# Patient Record
Sex: Male | Born: 1995 | Race: Black or African American | Hispanic: No | Marital: Single | Smoking: Never smoker
Health system: Southern US, Community
[De-identification: ages and names within clinical notes are randomized; demographics above are authoritative.]

## PROBLEM LIST (undated history)

## (undated) DIAGNOSIS — Z9109 Other allergy status, other than to drugs and biological substances: Secondary | ICD-10-CM

## (undated) DIAGNOSIS — J45909 Unspecified asthma, uncomplicated: Secondary | ICD-10-CM

## (undated) DIAGNOSIS — U071 COVID-19: Secondary | ICD-10-CM

## (undated) HISTORY — DX: Unspecified asthma, uncomplicated: J45.909

## (undated) HISTORY — DX: Other allergy status, other than to drugs and biological substances: Z91.09

---
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
## 2016-04-04 ENCOUNTER — Emergency Department (HOSPITAL_COMMUNITY)
Admission: EM | Admit: 2016-04-04 | Discharge: 2016-04-04 | Disposition: A | Discharge status: Home / Self Care | Payer: BLUE CROSS/BLUE SHIELD | Attending: Emergency Medicine | Admitting: Emergency Medicine

## 2016-04-04 ENCOUNTER — Encounter (HOSPITAL_COMMUNITY): Payer: Self-pay

## 2016-04-04 ENCOUNTER — Emergency Department (HOSPITAL_COMMUNITY): Payer: BLUE CROSS/BLUE SHIELD

## 2016-04-04 DIAGNOSIS — F901 Attention-deficit hyperactivity disorder, predominantly hyperactive type: Secondary | ICD-10-CM | POA: Insufficient documentation

## 2016-04-04 DIAGNOSIS — J45909 Unspecified asthma, uncomplicated: Secondary | ICD-10-CM | POA: Insufficient documentation

## 2016-04-04 DIAGNOSIS — Y929 Unspecified place or not applicable: Secondary | ICD-10-CM | POA: Diagnosis not present

## 2016-04-04 DIAGNOSIS — M25511 Pain in right shoulder: Secondary | ICD-10-CM | POA: Diagnosis present

## 2016-04-04 DIAGNOSIS — Y999 Unspecified external cause status: Secondary | ICD-10-CM | POA: Insufficient documentation

## 2016-04-04 DIAGNOSIS — Z79899 Other long term (current) drug therapy: Secondary | ICD-10-CM | POA: Diagnosis not present

## 2016-04-04 DIAGNOSIS — Y9367 Activity, basketball: Secondary | ICD-10-CM | POA: Insufficient documentation

## 2016-04-04 DIAGNOSIS — X58XXXA Exposure to other specified factors, initial encounter: Secondary | ICD-10-CM | POA: Diagnosis not present

## 2016-04-04 MED ORDER — HYDROCODONE-ACETAMINOPHEN 5-325 MG PO TABS: ORAL_TABLET | ORAL | 0 refills | Status: DC

## 2016-04-04 MED ORDER — IBUPROFEN 600 MG PO TABS: 600.0000 mg | ORAL_TABLET | Freq: Four times a day (QID) | ORAL | 0 refills | Status: DC | PRN

## 2016-04-04 NOTE — ED Triage Notes (Signed)
Pt reports was playing basketball and thinks dislocated his r shoulder.  Pt says thinks it went back in but still c/o pain.  Has history of same a few months ago.

## 2016-04-04 NOTE — Discharge Instructions (Signed)
Apply ice packs on/off to your shoulder.  Call your orthopedic doctor to arrange a follow-up appt.  °

## 2016-04-06 NOTE — ED Provider Notes (Signed)
[facility]-EM[facility] DEPT Provider Note   CSN: [id number] Arrival date & time: 04/04/16  1710     History   Chief Complaint Chief Complaint  Patient presents with  . Shoulder Pain    HPI Timothy Griffith is a 20 y.o. male.  HPI  Timothy Griffith is a 20 y.o. male who presents to the Emergency Department complaining of right shoulder pain after playing basketball.  Pain began after a reaching motion.  Describes a sudden, sharp pain at onset.  Pain improved with arm held near the body and worsens with raising the arm.  He believes that he may have dislocated his shoulder, but denies known previous injury.  He denies fall, swelling, neck pain, numbness or weakness of the extremity.   Past Medical History:  Diagnosis Date  . Asthma   . Environmental allergies     Patient Active Problem List   Diagnosis Date Noted  . Pain, joint, shoulder region, left 06/03/2012  . Exercise-induced asthma 06/03/2012  . Scoliosis 06/03/2012  . 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 ADD (attention deficit disorder with hyperactivity) 09/16/2011    History reviewed. No pertinent surgical history.     Home Medications    Prior to Admission medications   Medication Sig Start Date End Date Taking? Authorizing Provider  albuterol (PROVENTIL HFA;VENTOLIN HFA) 108 (90 BASE) MCG/ACT inhaler Inhale 2 puffs into the lungs every 6 (six) hours as needed for wheezing. 04/16/13   Elvina SidleKurt Lauenstein, MD  HYDROcodone-acetaminophen (NORCO/VICODIN) 5-325 MG tablet Take one-two tabs po q 4-6 hrs prn pain 04/04/16   Chavez Rosol, PA-C  ibuprofen (ADVIL,MOTRIN) 600 MG tablet Take 1 tablet (600 mg total) by mouth every 6 (six) hours as needed. Take with food 04/04/16   Celester Lech, PA-C  lisdexamfetamine (VYVANSE) 60 MG capsule Take 60 mg by mouth every morning.    Historical Provider, MD    Family History Family History  Problem Relation Age of Onset  . Hypertension Father   . Hyperlipidemia Father   .  Hypertension Paternal Grandfather   . Hyperlipidemia Paternal Grandfather     Social History Social History  Substance Use Topics  . Smoking status: Never Smoker  . Smokeless tobacco: Never Used  . Alcohol use No     Allergies   Cephalosporins   Review of Systems Review of Systems  Constitutional: Negative for chills and fever.  Cardiovascular: Negative for chest pain.  Musculoskeletal: Positive for arthralgias (right shoulder pain). Negative for joint swelling and neck pain.  Skin: Negative for color change and wound.  Neurological: Negative for weakness, numbness and headaches.  All other systems reviewed and are negative.    Physical Exam Updated Vital Signs BP 113/75   Pulse 70   Temp 98.9 F (37.2 C) (Oral)   Resp 16   Ht 6' (1.829 m)   Wt 83.9 kg   SpO2 99%   BMI 25.09 kg/m   Physical Exam  Constitutional: He is oriented to person, place, and time. He appears well-developed and well-nourished. No distress.  HENT:  Head: Normocephalic and atraumatic.  Neck: Normal range of motion. Neck supple. No thyromegaly present.  Cardiovascular: Normal rate, regular rhythm and intact distal pulses.   No murmur heard. Pulmonary/Chest: Effort normal and breath sounds normal. No respiratory distress. He exhibits no tenderness.  Musculoskeletal: He exhibits tenderness. He exhibits no edema or deformity.  ttp of the anterior right shoulder.  Pain with abduction of the right arm.  Radial pulse is brisk, distal  sensation intact, CR< 2 sec. Grip strength is strong and symmetrical.   No edema , erythema or step-off deformity of the joint.   Lymphadenopathy:    He has no cervical adenopathy.  Neurological: He is alert and oriented to person, place, and time. He has normal strength. No sensory deficit. He exhibits normal muscle tone. Coordination normal.  Reflex Scores:      Tricep reflexes are 1+ on the right side and 2+ on the left side.      Bicep reflexes are 1+ on the right  side and 2+ on the left side. Skin: Skin is warm and dry.  Nursing note and vitals reviewed.    ED Treatments / Results  Labs (all labs ordered are listed, but only abnormal results are displayed) Labs Reviewed - No data to display  EKG  EKG Interpretation None       Radiology Dg Shoulder Right  Result Date: 04/04/2016 CLINICAL DATA:  Right shoulder pain after basketball injury today. EXAM: RIGHT SHOULDER - 2+ VIEW COMPARISON:  None. FINDINGS: There is no evidence of fracture or dislocation. There is no evidence of arthropathy or other focal bone abnormality. Soft tissues are unremarkable. IMPRESSION: Normal right shoulder. Electronically Signed   By: Lupita Raider, M.D.   On: 04/04/2016 18:36    Procedures Procedures (including critical care time)  Medications Ordered in ED Medications - No data to display   Initial Impression / Assessment and Plan / ED Course  I have reviewed the triage vital signs and the nursing notes.  Pertinent labs & imaging results that were available during my care of the patient were reviewed by me and considered in my medical decision making (see chart for details).  Clinical Course    XR neg for fx or dislocation.  NV intact.  Discussed importance of close orthopedic f/u and possibility of rotator cuff injury.    Sling given for comfort and wear instructions given.  Agrees to ice, NSAID.    Final Clinical Impressions(s) / ED Diagnoses   Final diagnoses:  Shoulder pain, acute, right    New Prescriptions Discharge Medication List as of 04/04/2016  7:34 PM    START taking these medications   Details  HYDROcodone-acetaminophen (NORCO/VICODIN) 5-325 MG tablet Take one-two tabs po q 4-6 hrs prn pain, Print    ibuprofen (ADVIL,MOTRIN) 600 MG tablet Take 1 tablet (600 mg total) by mouth every 6 (six) hours as needed. Take with food, Starting Wed 04/04/2016, Print         Pauline Aus, [place] 04/06/16 1225    Raeford Razor,  MD 04/14/16 1043

## 2017-08-24 ENCOUNTER — Ambulatory Visit (HOSPITAL_COMMUNITY)
Admission: EM | Admit: 2017-08-24 | Discharge: 2017-08-24 | Disposition: A | Discharge status: Home / Self Care | Payer: BLUE CROSS/BLUE SHIELD | Attending: Family Medicine | Admitting: Family Medicine

## 2017-08-24 ENCOUNTER — Other Ambulatory Visit: Payer: Self-pay

## 2017-08-24 ENCOUNTER — Encounter (HOSPITAL_COMMUNITY): Payer: Self-pay | Admitting: Physician Assistant

## 2017-08-24 DIAGNOSIS — J0140 Acute pansinusitis, unspecified: Secondary | ICD-10-CM | POA: Diagnosis not present

## 2017-08-24 MED ORDER — BENZONATATE 100 MG PO CAPS: 100.0000 mg | ORAL_CAPSULE | Freq: Three times a day (TID) | ORAL | 0 refills | Status: DC

## 2017-08-24 MED ORDER — CETIRIZINE-PSEUDOEPHEDRINE ER 5-120 MG PO TB12: 1.0000 | ORAL_TABLET | Freq: Every day | ORAL | 0 refills | Status: DC

## 2017-08-24 MED ORDER — DOXYCYCLINE HYCLATE 100 MG PO CAPS: 100.0000 mg | ORAL_CAPSULE | Freq: Two times a day (BID) | ORAL | 0 refills | Status: DC

## 2017-08-24 MED ORDER — FLUTICASONE PROPIONATE 50 MCG/ACT NA SUSP: 2.0000 | Freq: Every day | NASAL | 0 refills | Status: DC

## 2017-08-24 NOTE — ED Provider Notes (Signed)
[facility]    CSN: [id number] Arrival date & time: 08/24/17  1655     History   Chief Complaint No chief complaint on file.   HPI Timothy Griffith is a 22 y.o. male.   22 year old male comes in with mother for 3 week history of URI symptoms. Has had productive cough, rhinorrhea, nasal congestion.  States he initially had a sore throat that has now resolved.  Denies fever, chills, night sweats.  He has not had to use his inhaler.  OTC cold medications with some relief.  Never smoker.      Past Medical History:  Diagnosis Date  . Asthma   . Environmental allergies     Patient Active Problem List   Diagnosis Date Noted  . Pain, joint, shoulder region, left 06/03/2012  . Exercise-induced asthma 06/03/2012  . Scoliosis 06/03/2012  . 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 ADD (attention deficit disorder with hyperactivity) 09/16/2011    History reviewed. No pertinent surgical history.     Home Medications    Prior to Admission medications   Medication Sig Start Date End Date Taking? Authorizing Provider  albuterol (PROVENTIL HFA;VENTOLIN HFA) 108 (90 BASE) MCG/ACT inhaler Inhale 2 puffs into the lungs every 6 (six) hours as needed for wheezing. 04/16/13   Elvina Sidle, MD  benzonatate (TESSALON) 100 MG capsule Take 1 capsule (100 mg total) by mouth every 8 (eight) hours. 08/24/17   Cathie Hoops, Harmonie Verrastro V, PA-C  cetirizine-pseudoephedrine (ZYRTEC-D) 5-120 MG tablet Take 1 tablet by mouth daily. 08/24/17   Cathie Hoops, Seneca Gadbois V, PA-C  doxycycline (VIBRAMYCIN) 100 MG capsule Take 1 capsule (100 mg total) by mouth 2 (two) times daily. 08/24/17   Cathie Hoops, Sloan Takagi V, PA-C  fluticasone (FLONASE) 50 MCG/ACT nasal spray Place 2 sprays into both nostrils daily. 08/24/17   Cathie Hoops, Elynore Dolinski V, PA-C  ibuprofen (ADVIL,MOTRIN) 600 MG tablet Take 1 tablet (600 mg total) by mouth every 6 (six) hours as needed. Take with food 04/04/16   Triplett, Tammy, PA-C  lisdexamfetamine (VYVANSE) 60 MG capsule Take 60 mg by mouth  every morning.    [provider]    Family History Family History  Problem Relation Age of Onset  . Hypertension Father   . Hyperlipidemia Father   . Hypertension Paternal Grandfather   . Hyperlipidemia Paternal Grandfather     Social History Social History   Tobacco Use  . Smoking status: Never Smoker  . Smokeless tobacco: Never Used  Substance Use Topics  . Alcohol use: No  . Drug use: No     Allergies   Cephalosporins   Review of Systems Review of Systems  Reason unable to perform ROS: See HPI as above.     Physical Exam Triage Vital Signs ED Triage Vitals [08/24/17 1852]  Enc Vitals Group     BP 113/62     Pulse Rate 61     Resp 16     Temp 99 F (37.2 C)     Temp Source Oral     SpO2 96 %     Weight      Height      Head Circumference      Peak Flow      Pain Score      Pain Loc      Pain Edu?      Excl. in [place]?    No data found.  Updated Vital Signs BP 113/62 (BP Location: Left Arm)   Pulse 61  Temp 99 F (37.2 C) (Oral)   Resp 16   SpO2 96%   Physical Exam  Constitutional: He is oriented to person, place, and time. He appears well-developed and well-nourished. No distress.  HENT:  Head: Normocephalic and atraumatic.  Right Ear: External ear and ear canal normal. Tympanic membrane is erythematous. Tympanic membrane is not bulging.  Left Ear: External ear and ear canal normal. Tympanic membrane is erythematous. Tympanic membrane is not bulging.  Nose: Mucosal edema and rhinorrhea present. Right sinus exhibits no maxillary sinus tenderness and no frontal sinus tenderness. Left sinus exhibits no maxillary sinus tenderness and no frontal sinus tenderness.  Mouth/Throat: Uvula is midline, oropharynx is clear and moist and mucous membranes are normal.  Eyes: Conjunctivae are normal. Pupils are equal, round, and reactive to light.  Neck: Normal range of motion. Neck supple.  Cardiovascular: Normal rate, regular rhythm and normal heart  sounds. Exam reveals no gallop and no friction rub.  No murmur heard. Pulmonary/Chest: Effort normal and breath sounds normal. He has no decreased breath sounds. He has no wheezes. He has no rhonchi. He has no rales.  Lymphadenopathy:    He has no cervical adenopathy.  Neurological: He is alert and oriented to person, place, and time.  Skin: Skin is warm and dry.  Psychiatric: He has a normal mood and affect. His behavior is normal. Judgment normal.     UC Treatments / Results  Labs (all labs ordered are listed, but only abnormal results are displayed) Labs Reviewed - No data to display  EKG  EKG Interpretation None       Radiology No results found.  Procedures Procedures (including critical care time)  Medications Ordered in UC Medications - No data to display   Initial Impression / Assessment and Plan / UC Course  I have reviewed the triage vital signs and the nursing notes.  Pertinent labs & imaging results that were available during my care of the patient were reviewed by me and considered in my medical decision making (see chart for details).    Start doxycycline for sinusitis.  Other symptomatic treatment discussed.  Push fluids.  Return precautions given.  Patient expresses understanding and agrees to plan.  Final Clinical Impressions(s) / UC Diagnoses   Final diagnoses:  Acute non-recurrent pansinusitis    ED Discharge Orders        Ordered    fluticasone (FLONASE) 50 MCG/ACT nasal spray  Daily     08/24/17 1946    cetirizine-pseudoephedrine (ZYRTEC-D) 5-120 MG tablet  Daily     08/24/17 1946    doxycycline (VIBRAMYCIN) 100 MG capsule  2 times daily     08/24/17 1946    benzonatate (TESSALON) 100 MG capsule  Every 8 hours     08/24/17 1946        Belinda FisherYu, Tyianna Menefee V, PA-C 08/24/17 1950

## 2017-08-24 NOTE — ED Triage Notes (Signed)
Seen  by provider only

## 2017-08-24 NOTE — Discharge Instructions (Signed)
Start doxycycline for sinus infection. Tessalon for cough. Start flonase, zyrtec-D for nasal congestion. You can use over the counter nasal saline rinse such as neti pot for nasal congestion. Keep hydrated, your urine should be clear to pale yellow in color. Tylenol/motrin for fever and pain. Monitor for any worsening of symptoms, chest pain, shortness of breath, wheezing, swelling of the throat, follow up for reevaluation.

## 2019-03-09 ENCOUNTER — Other Ambulatory Visit: Payer: Self-pay

## 2019-03-09 ENCOUNTER — Ambulatory Visit
Admission: EM | Admit: 2019-03-09 | Discharge: 2019-03-09 | Disposition: A | Discharge status: Home / Self Care | Payer: BLUE CROSS/BLUE SHIELD | Attending: Emergency Medicine | Admitting: Emergency Medicine

## 2019-03-09 DIAGNOSIS — L259 Unspecified contact dermatitis, unspecified cause: Secondary | ICD-10-CM | POA: Diagnosis not present

## 2019-03-09 DIAGNOSIS — R21 Rash and other nonspecific skin eruption: Secondary | ICD-10-CM

## 2019-03-09 DIAGNOSIS — L299 Pruritus, unspecified: Secondary | ICD-10-CM

## 2019-03-09 MED ORDER — HYDROCORTISONE 1 % EX CREA: 1.0000 "application " | TOPICAL_CREAM | Freq: Two times a day (BID) | CUTANEOUS | 0 refills | Status: DC

## 2019-03-09 NOTE — Discharge Instructions (Signed)
Wash with warm water and mild soap Hydrocortisone cream prescribed.  Use as directed.  DO NOT use longer than 2 weeks on face or skin folds as this may cause skin thinning Continue with benadryl and/or OTC zyrtec, allegra, or claritin as needed for itching Follow up with PCP if symptoms persists  Return or go to the ED if you have any new or worsening symptoms such as fever, chills, nausea, vomiting, worsening rash, difficulty breathing/ swallowing, mouth/ throat swelling/ tightness, abdominal pain, changes in bowel or bladder function, symptoms do not improve with medications, etc..Marland Kitchen

## 2019-03-09 NOTE — ED Provider Notes (Signed)
[facility]   [id number] 03/09/19 Arrival Time: 1514  CC: Rash  SUBJECTIVE:  Timothy Griffith is a 23 y.o. male who presents with a rash that began this morning.  Denies precipitating event or trauma.  Denies changes in soaps, detergents, close contacts with similar rash, known trigger or environmental trigger, allergy. Denies medications change or starting a new medication recently.  Localizes the rash to forehead, and right cheek.  Describes it as improving, and itching.  Has tried OTC benadryl with relief.  Denies aggravating factors.  Denies similar symptoms in the past.  Does admit to a hx of eczema around his neck.   Denies fever, chills, nausea, vomiting, erythema, swelling, discharge, oral lesions, SOB, dyspnea, throat swelling/ closing, chest pain, abdominal pain, changes in bowel or bladder function.    ROS: As per HPI.  All other pertinent ROS negative.     Past Medical History:  Diagnosis Date  . Asthma   . Environmental allergies    History reviewed. No pertinent surgical history. Allergies  Allergen Reactions  . Cephalosporins    No current facility-administered medications on file prior to encounter.    Current Outpatient Medications on File Prior to Encounter  Medication Sig Dispense Refill  . ibuprofen (ADVIL,MOTRIN) 600 MG tablet Take 1 tablet (600 mg total) by mouth every 6 (six) hours as needed. Take with food 30 tablet 0  . [DISCONTINUED] albuterol (PROVENTIL HFA;VENTOLIN HFA) 108 (90 BASE) MCG/ACT inhaler Inhale 2 puffs into the lungs every 6 (six) hours as needed for wheezing. 1 Inhaler 3  . [DISCONTINUED] fluticasone (FLONASE) 50 MCG/ACT nasal spray Place 2 sprays into both nostrils daily. 1 g 0  . [DISCONTINUED] lisdexamfetamine (VYVANSE) 60 MG capsule Take 60 mg by mouth every morning.     Social History   Socioeconomic History  . Marital status: Single    Spouse name: Not on file  . Number of children: Not on file  . Years of education: Not on  file  . Highest education level: Not on file  Occupational History  . Not on file  Social Needs  . Financial resource strain: Not on file  . Food insecurity    Worry: Not on file    Inability: Not on file  . Transportation needs    Medical: Not on file    Non-medical: Not on file  Tobacco Use  . Smoking status: Never Smoker  . Smokeless tobacco: Never Used  Substance and Sexual Activity  . Alcohol use: No  . Drug use: No  . Sexual activity: Never  Lifestyle  . Physical activity    Days per week: Not on file    Minutes per session: Not on file  . Stress: Not on file  Relationships  . Social Musicianconnections    Talks on phone: Not on file    Gets together: Not on file    Attends religious service: Not on file    Active member of club or organization: Not on file    Attends meetings of clubs or organizations: Not on file    Relationship status: Not on file  . Intimate partner violence    Fear of current or ex partner: Not on file    Emotionally abused: Not on file    Physically abused: Not on file    Forced sexual activity: Not on file  Other Topics Concern  . Not on file  Social History Narrative  . Not on file   Family History  Problem  Relation Age of Onset  . Hypertension Father   . Hyperlipidemia Father   . Hypertension Paternal Grandfather   . Hyperlipidemia Paternal Grandfather     OBJECTIVE: Vitals:   03/09/19 1526  BP: 135/75  Pulse: (!) 59  Resp: 18  Temp: 98.7 F (37.1 C)  SpO2: 96%    General appearance: alert; no distress Head: NCAT Lungs: clear to auscultation bilaterally Heart: regular rate and rhythm.   Extremities: no edema Skin: warm and dry; few flesh colored papules localized to forehead, and right nasolabial crease; NTTP; no obvious drainage or bleeding Psychological: alert and cooperative; normal mood and affect  ASSESSMENT & PLAN:  1. Contact dermatitis, unspecified contact dermatitis type, unspecified trigger   2. Rash of face    3. Itching    Meds ordered this encounter  Medications  . hydrocortisone cream 1 %    Sig: Apply 1 application topically 2 (two) times daily.    Dispense:  30 g    Refill:  0    Order Specific Question:   Supervising Provider    Answer:   Raylene Everts [[id number]]   Wash with warm water and mild soap Hydrocortisone cream prescribed.  Use as directed.  DO NOT use longer than 2 weeks on face or skin folds as this may cause skin thinning Continue with benadryl and/or OTC zyrtec, allegra, or claritin as needed for itching Follow up with PCP if symptoms persists  Return or go to the ED if you have any new or worsening symptoms such as fever, chills, nausea, vomiting, worsening rash, difficulty breathing/ swallowing, mouth/ throat swelling/ tightness, abdominal pain, changes in bowel or bladder function, symptoms do not improve with medications, etc...  Reviewed expectations re: course of current medical issues. Questions answered. Outlined signs and symptoms indicating need for more acute intervention. Patient verbalized understanding. After Visit Summary given.   Lestine Box, PA-C 03/09/19 1615

## 2019-03-09 NOTE — ED Triage Notes (Signed)
Woke up this morning with rash on face, small bumps noted on forehead

## 2019-04-22 ENCOUNTER — Other Ambulatory Visit: Payer: Self-pay

## 2019-04-22 DIAGNOSIS — U071 COVID-19: Secondary | ICD-10-CM

## 2019-04-23 LAB — NOVEL CORONAVIRUS, NAA: SARS-CoV-2, NAA: NOT DETECTED

## 2019-08-23 ENCOUNTER — Ambulatory Visit
Admission: EM | Admit: 2019-08-23 | Discharge: 2019-08-23 | Disposition: A | Discharge status: Home / Self Care | Payer: PRIVATE HEALTH INSURANCE | Attending: Emergency Medicine | Admitting: Emergency Medicine

## 2019-08-23 ENCOUNTER — Other Ambulatory Visit: Payer: Self-pay

## 2019-08-23 DIAGNOSIS — Z20822 Contact with and (suspected) exposure to covid-19: Secondary | ICD-10-CM | POA: Diagnosis not present

## 2019-08-23 NOTE — ED Triage Notes (Signed)
Pt presents to UC stating that he has chills, fatigue, sinus congestion since yesterday. Pt lives with aunt that tested positive

## 2019-08-23 NOTE — Discharge Instructions (Addendum)

## 2019-08-23 NOTE — ED Provider Notes (Signed)
[facility]    CSN: [id number] Arrival date & time: 08/23/19  1154      History   Chief Complaint Chief Complaint  Patient presents with  . covid sxs    HPI Timothy Griffith is a 24 y.o. male.   Timothy Griffith 24 years old male presented to the urgent care for complaint of chills and fever, fatigue, congestion for the past 1 day.  Reported  positive Covid exposure. Denies sick exposure to  flu or strep.  Denies recent travel.  Denies aggravating or alleviating symptoms.  Denies previous COVID infection.   Denies fever, chills, fatigue, nasal congestion, rhinorrhea, sore throat, cough, SOB, wheezing, chest pain, nausea, vomiting, changes in bowel or bladder habits.       Past Medical History:  Diagnosis Date  . Asthma   . Environmental allergies     Patient Active Problem List   Diagnosis Date Noted  . Pain, joint, shoulder region, left 06/03/2012  . Exercise-induced asthma 06/03/2012  . Scoliosis 06/03/2012  . 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 ADD (attention deficit disorder with hyperactivity) 09/16/2011    History reviewed. No pertinent surgical history.     Home Medications    Prior to Admission medications   Medication Sig Start Date End Date Taking? Authorizing Provider  hydrocortisone cream 1 % Apply 1 application topically 2 (two) times daily. 03/09/19   Wurst, [place], PA-C  ibuprofen (ADVIL,MOTRIN) 600 MG tablet Take 1 tablet (600 mg total) by mouth every 6 (six) hours as needed. Take with food 04/04/16   Triplett, Tammy, PA-C  albuterol (PROVENTIL HFA;VENTOLIN HFA) 108 (90 BASE) MCG/ACT inhaler Inhale 2 puffs into the lungs every 6 (six) hours as needed for wheezing. 04/16/13 03/09/19  Robyn Haber, MD  fluticasone (FLONASE) 50 MCG/ACT nasal spray Place 2 sprays into both nostrils daily. 08/24/17 03/09/19  Tasia Catchings, Amy V, PA-C  lisdexamfetamine (VYVANSE) 60 MG capsule Take 60 mg by mouth every morning.  03/09/19  [provider]    Family  History Family History  Problem Relation Age of Onset  . Hypertension Father   . Hyperlipidemia Father   . Hypertension Paternal Grandfather   . Hyperlipidemia Paternal Grandfather     Social History Social History   Tobacco Use  . Smoking status: Never Smoker  . Smokeless tobacco: Never Used  Substance Use Topics  . Alcohol use: No  . Drug use: No     Allergies   Cephalosporins   Review of Systems Review of Systems  Constitutional: Positive for chills, fatigue and fever.  HENT: Positive for congestion.   Respiratory: Negative.   Cardiovascular: Negative.   Gastrointestinal: Negative.   Neurological: Negative.   All other systems reviewed and are negative.    Physical Exam Triage Vital Signs ED Triage Vitals  Enc Vitals Group     BP      Pulse      Resp      Temp      Temp src      SpO2      Weight      Height      Head Circumference      Peak Flow      Pain Score      Pain Loc      Pain Edu?      Excl. in [place]?    No data found.  Updated Vital Signs BP 130/76 (BP Location: Right Arm)   Pulse 69   Temp 99 F (  37.2 C) (Oral)   Resp 16   SpO2 95%   Visual Acuity Right Eye Distance:   Left Eye Distance:   Bilateral Distance:    Right Eye Near:   Left Eye Near:    Bilateral Near:     Physical Exam Vitals and nursing note reviewed.  Constitutional:      General: He is not in acute distress.    Appearance: Normal appearance. He is normal weight. He is not ill-appearing or toxic-appearing.  HENT:     Head: Normocephalic.     Right Ear: Tympanic membrane, ear canal and external ear normal. There is no impacted cerumen.     Left Ear: Tympanic membrane, ear canal and external ear normal. There is no impacted cerumen.     Nose: Nose normal. No congestion.     Mouth/Throat:     Mouth: Mucous membranes are moist.     Pharynx: Oropharynx is clear. No oropharyngeal exudate or posterior oropharyngeal erythema.  Cardiovascular:     Rate and  Rhythm: Normal rate and regular rhythm.     Pulses: Normal pulses.     Heart sounds: Normal heart sounds. No murmur.  Pulmonary:     Effort: Pulmonary effort is normal. No respiratory distress.     Breath sounds: Normal breath sounds. No wheezing or rhonchi.  Chest:     Chest wall: No tenderness.  Abdominal:     General: Abdomen is flat. Bowel sounds are normal. There is no distension.     Palpations: There is no mass.     Tenderness: There is no abdominal tenderness.  Skin:    Capillary Refill: Capillary refill takes less than 2 seconds.  Neurological:     General: No focal deficit present.     Mental Status: He is alert and oriented to person, place, and time.      UC Treatments / Results  Labs (all labs ordered are listed, but only abnormal results are displayed) Labs Reviewed  NOVEL CORONAVIRUS, NAA    EKG   Radiology No results found.  Procedures Procedures (including critical care time)  Medications Ordered in UC Medications - No data to display  Initial Impression / Assessment and Plan / UC Course  I have reviewed the triage vital signs and the nursing notes.  Pertinent labs & imaging results that were available during my care of the patient were reviewed by me and considered in my medical decision making (see chart for details).    COVID-19 test was ordered Advised patient to quarantine To go to ED for worsening of symptoms Patient verbalized understanding plan of care  Final Clinical Impressions(s) / UC Diagnoses   Final diagnoses:  Suspected COVID-19 virus infection     Discharge Instructions     COVID testing ordered.  It will take between 2-7 days for test results.  Someone will contact you regarding abnormal results.    In the meantime: You should remain isolated in your home for 10 days from symptom onset AND greater than 72 hours after symptoms resolution (absence of fever without the use of fever-reducing medication and improvement in  respiratory symptoms), whichever is longer Get plenty of rest and push fluids Use medications daily for symptom relief Use OTC medications like ibuprofen or tylenol as needed fever or pain Call or go to the ED if you have any new or worsening symptoms such as fever, worsening cough, shortness of breath, chest tightness, chest pain, turning blue, changes in mental status, etc..Marland Kitchen  ED Prescriptions    None     PDMP not reviewed this encounter.   Durward Parcel, FNP 08/23/19 1331

## 2019-08-24 LAB — NOVEL CORONAVIRUS, NAA: SARS-CoV-2, NAA: DETECTED — AB

## 2019-08-25 ENCOUNTER — Encounter (HOSPITAL_COMMUNITY): Payer: Self-pay

## 2020-08-06 ENCOUNTER — Other Ambulatory Visit: Payer: Self-pay

## 2020-08-06 ENCOUNTER — Ambulatory Visit
Admission: EM | Admit: 2020-08-06 | Discharge: 2020-08-06 | Disposition: A | Discharge status: Home / Self Care | Payer: No Typology Code available for payment source | Attending: Internal Medicine | Admitting: Internal Medicine

## 2020-08-06 DIAGNOSIS — Z1152 Encounter for screening for COVID-19: Secondary | ICD-10-CM | POA: Diagnosis not present

## 2020-08-06 NOTE — ED Triage Notes (Signed)
Needs covid test

## 2020-08-10 LAB — COVID-19, FLU A+B NAA
Influenza A, NAA: NOT DETECTED
Influenza B, NAA: NOT DETECTED
SARS-CoV-2, NAA: DETECTED — AB

## 2021-09-13 ENCOUNTER — Telehealth: Payer: Self-pay

## 2021-09-13 NOTE — Telephone Encounter (Signed)
Mother, Timothy Griffith has called to get patient established care.  States she is a current patient of Dr. Jerline Pain and would like patient to also be a patient.  Please advise?

## 2021-09-14 NOTE — Telephone Encounter (Signed)
LVM for patient to call back and schedule appt with Dr. Parker  

## 2021-09-14 NOTE — Telephone Encounter (Signed)
Yes that is ok.  Katina Degree. [name], MD 09/14/2021 8:03 AM

## 2021-09-14 NOTE — Telephone Encounter (Signed)
Ok to schedule establish care with Dr [name]

## 2021-09-25 ENCOUNTER — Encounter: Payer: Self-pay | Admitting: Family Medicine

## 2021-09-25 ENCOUNTER — Ambulatory Visit (INDEPENDENT_AMBULATORY_CARE_PROVIDER_SITE_OTHER): Payer: PRIVATE HEALTH INSURANCE | Admitting: Family Medicine

## 2021-09-25 ENCOUNTER — Other Ambulatory Visit: Payer: Self-pay

## 2021-09-25 VITALS — BP 107/61 | HR 63 | Temp 98.0°F | Ht 73.0 in | Wt 217.0 lb

## 2021-09-25 DIAGNOSIS — R059 Cough, unspecified: Secondary | ICD-10-CM

## 2021-09-25 DIAGNOSIS — M25511 Pain in right shoulder: Secondary | ICD-10-CM | POA: Diagnosis not present

## 2021-09-25 DIAGNOSIS — J45990 Exercise induced bronchospasm: Secondary | ICD-10-CM | POA: Diagnosis not present

## 2021-09-25 MED ORDER — ALBUTEROL SULFATE HFA 108 (90 BASE) MCG/ACT IN AERS: 2.0000 | INHALATION_SPRAY | Freq: Four times a day (QID) | RESPIRATORY_TRACT | 3 refills | Status: AC | PRN

## 2021-09-25 NOTE — Patient Instructions (Signed)
It was very nice to see you today!  Please take 1 to 2 puffs of the albuterol about 30 minutes before your exercise.  Let me know if this does not improve your asthma symptoms and we can refer you to see a pulmonologist.  I will refer you to see sports medicine doctor for your shoulder.  We will see back in about a year.  Come back sooner if needed.  Take care, Dr Jerline Pain  PLEASE NOTE:  If you had any lab tests please let us know if you have not heard back within a few days. You may see your results on mychart before we have a chance to review them but we will give you a call once they are reviewed by [place]. If we ordered any referrals today, please let us know if you have not heard from their office within the next week.   Please try these tips to maintain a healthy lifestyle:  Eat at least 3 REAL meals and 1-2 snacks per day.  Aim for no more than 5 hours between eating.  If you eat breakfast, please do so within one hour of getting up.   Each meal should contain half fruits/vegetables, one quarter protein, and one quarter carbs (no bigger than a computer mouse)  Cut down on sweet beverages. This includes juice, soda, and sweet tea.   Drink at least 1 glass of water with each meal and aim for at least 8 glasses per day  Exercise at least 150 minutes every week.

## 2021-09-25 NOTE — Assessment & Plan Note (Signed)
Normal exam today.  We will start albuterol prior to exercise.  He will let me know if not proving and we can refer to pulmonology.

## 2021-09-25 NOTE — Progress Notes (Signed)
° °  Timothy Griffith is a 26 y.o. male who presents today for an office visit.  He is a new patient.   Assessment/Plan:  Chronic Problems Addressed Today: Exercise-induced asthma Normal exam today.  We will start albuterol prior to exercise.  He will let me know if not proving and we can refer to pulmonology.  Right shoulder pain Was diagnosed with subluxation about 5 or 6 years ago.  No obvious abnormalities on exam today.  Will place referral to sports medicine for further evaluation.  He will follow up in a year for annual CPE.     Subjective:  HPI:  He is seen as a new patient.  He has no acute concerns today.  His asthma has not been well controlled.  He has been planning on trying to join the [employer].  He has decreased exercise tolerance and has flareup of his asthma when he runs for long distances.  He has been diagnosed with asthma in the past and was given albuterol inhaler but this has been several years ago for does not have any symptoms at rest.  He also injured his right shoulder about 5 or 6 years ago.  Was diagnosed with a subluxation.  He had a trial of physical therapy with some improvement.  Since then he occasionally has recurrence of symptoms.  No symptoms at rest.       Objective:  Physical Exam: BP 107/61    Pulse 63    Temp 98 F (36.7 C) (Temporal)    Ht 6\' 1"  (1.854 m)    Wt 217 lb (98.4 kg)    SpO2 94%    BMI 28.63 kg/m   Gen: No acute distress, resting comfortably CV: Regular rate and rhythm with no murmurs appreciated Pulm: Normal work of breathing, clear to auscultation bilaterally with no crackles, wheezes, or rhonchi Neuro: Grossly normal, moves all extremities Psych: Normal affect and thought content      Horald Birky M. , MD 09/25/2021 2:35 PM

## 2021-09-25 NOTE — Assessment & Plan Note (Signed)
Was diagnosed with subluxation about 5 or 6 years ago.  No obvious abnormalities on exam today.  Will place referral to sports medicine for further evaluation.

## 2021-09-28 ENCOUNTER — Ambulatory Visit: Payer: Self-pay

## 2021-09-28 ENCOUNTER — Ambulatory Visit (INDEPENDENT_AMBULATORY_CARE_PROVIDER_SITE_OTHER): Payer: PRIVATE HEALTH INSURANCE | Admitting: Family Medicine

## 2021-09-28 ENCOUNTER — Other Ambulatory Visit: Payer: Self-pay

## 2021-09-28 ENCOUNTER — Ambulatory Visit (INDEPENDENT_AMBULATORY_CARE_PROVIDER_SITE_OTHER): Payer: PRIVATE HEALTH INSURANCE

## 2021-09-28 VITALS — BP 100/76 | HR 53 | Wt 218.6 lb

## 2021-09-28 DIAGNOSIS — M25511 Pain in right shoulder: Secondary | ICD-10-CM

## 2021-09-28 DIAGNOSIS — S43001S Unspecified subluxation of right shoulder joint, sequela: Secondary | ICD-10-CM | POA: Diagnosis not present

## 2021-09-28 DIAGNOSIS — G8929 Other chronic pain: Secondary | ICD-10-CM

## 2021-09-28 NOTE — Progress Notes (Signed)
I, Timothy Griffith, LAT, ATC acting as a scribe for Timothy Leader, MD.  Subjective:    CC: R shoulder pain  HPI: Pt is a 26 y/o male c/o R shoulder pain. Pt suffered a R shoulder subluxation on 04/04/16, while playing basketball and was seen at the [facility]. Pt reports R shoulder pain ongoing for since the initial subluxation incident. Pt notes R shoulder pain never resolved and has continued to experiences additional subluxation. Pt reports not having any R shoulder pain unless experiencing a subluxation.  He notes multiple episodes of shoulder subluxation events occurring over the last several years most recently a few months ago.  He had to stop playing basketball because the abducted extended externally rotated position is uncomfortable and is a position that is vulnerable for subluxation or dislocation for him.  He notes he is dislocated or subluxed his shoulder sleeping.  Of note he currently is in the application process for the [employer].  Mechanical symptoms: yes UE numbness/tingling: no UE weakness: no Aggravates: sleeping prone w/ arms overhead,  Treatments tried: prior PT,   Dx imaging: 04/04/16 R shoulder XR  Pertinent review of Systems: No fevers or chills  Relevant historical information: Exercise-induced asthma.  ADHD.   Objective:    Vitals:   09/28/21 1300  BP: 100/76  Pulse: (!) 53  SpO2: 96%   General: Well Developed, well nourished, and in no acute distress.   MSK: Right shoulder normal-appearing Normal motion discomfort with abduction and external rotation. Intact strength. Negative Hawkins and Neer's test. Negative Yergason's and speeds test. Mildly positive O'Brien test. Mildly positive clunk test. Positive anterior apprehension test and relocation test. Pulses cap refill and sensation are intact distally.  Lab and Radiology Results  X-ray images right shoulder obtained today personally and independently interpreted No acute  fractures.  No significant degenerative changes are visible. Await formal radiology review    Impression and Recommendations:    Assessment and Plan: 26 y.o. male with right shoulder subluxation and instability.  Since 2017 Timothy Griffith has had multiple episodes of shoulder subluxation.  He has some positive labrum physical exam test today and a physical exam to suggestive of shoulder instability including anterior apprehension test.  I think at this point we should proceed to MRI arthrogram to evaluate for labrum tear and for potential surgical planning.  He would be willing to consider surgery.  Updated x-ray today prior to MRI arthrogram..  PDMP not reviewed this encounter. Orders Placed This Encounter  Procedures   [place] LIMITED JOINT SPACE STRUCTURES UP RIGHT(NO LINKED CHARGES)    Standing Status:   Future    Number of Occurrences:   1    Standing Expiration Date:   03/28/2022    Order Specific Question:   Reason for Exam (SYMPTOM  OR DIAGNOSIS REQUIRED)    Answer:   right shoulder pain    Order Specific Question:   Preferred imaging location?    Answer:   [place]   DG Shoulder Right    Standing Status:   Future    Number of Occurrences:   1    Standing Expiration Date:   09/28/2022    Order Specific Question:   Reason for Exam (SYMPTOM  OR DIAGNOSIS REQUIRED)    Answer:   right shoulder pain    Order Specific Question:   Preferred imaging location?    Answer:   [place]  To evaluate chronic R shoulder subluxations    Standing Status:   Future    Standing Expiration Date:   09/28/2022    Scheduling Instructions:     Schedule w/ Dr. Darene Lamer 1 hour prior for injection    Order Specific Question:   Reason for Exam (SYMPTOM  OR DIAGNOSIS REQUIRED)    Answer:   right shoulder pain    Order Specific Question:   If indicated for the ordered procedure, I authorize the administration of contrast media per Radiology protocol     Answer:   Yes    Order Specific Question:   What is the patient's sedation requirement?    Answer:   No Sedation    Order Specific Question:   Does the patient have a pacemaker or implanted devices?    Answer:   No    Order Specific Question:   Preferred imaging location?    Answer:   [facility] development [facility] (table limit-350lbs)   No orders of the defined types were placed in this encounter.   Discussed warning signs or symptoms. Please see discharge instructions. Patient expresses understanding.   The above documentation has been reviewed and is accurate and complete Timothy Griffith, M.D.

## 2021-09-28 NOTE — Patient Instructions (Addendum)
Thank you for coming in today.   Please get an Xray today before you leave   You should hear from MRI scheduling within 1 week. If you do not hear please let me know.    Recheck back after MRI arthrogram

## 2021-10-02 NOTE — Progress Notes (Signed)
Right shoulder x-ray looks normal to radiology.  MRI arthrogram should be helpful

## 2021-10-16 ENCOUNTER — Ambulatory Visit (INDEPENDENT_AMBULATORY_CARE_PROVIDER_SITE_OTHER): Payer: PRIVATE HEALTH INSURANCE | Admitting: Sports Medicine

## 2021-10-16 ENCOUNTER — Ambulatory Visit (INDEPENDENT_AMBULATORY_CARE_PROVIDER_SITE_OTHER): Payer: PRIVATE HEALTH INSURANCE

## 2021-10-16 ENCOUNTER — Ambulatory Visit (INDEPENDENT_AMBULATORY_CARE_PROVIDER_SITE_OTHER): Payer: Self-pay

## 2021-10-16 ENCOUNTER — Other Ambulatory Visit: Payer: Self-pay

## 2021-10-16 DIAGNOSIS — M25511 Pain in right shoulder: Secondary | ICD-10-CM

## 2021-10-16 DIAGNOSIS — G8929 Other chronic pain: Secondary | ICD-10-CM

## 2021-10-16 NOTE — Assessment & Plan Note (Signed)
Injection as above for MR arthrogram. ?

## 2021-10-16 NOTE — Progress Notes (Signed)
? ? ?  Procedures performed today:   ? ?Procedure: Real-time Ultrasound Guided gadolinium contrast injection of right glenohumeral joint ?Device: Samsung HS60  ?Verbal informed consent obtained.  ?Time-out conducted.  ?Noted no overlying erythema, induration, or other signs of local infection.  ?Skin prepped in a sterile fashion.  ?Local anesthesia: Topical Ethyl chloride.  ?With sterile technique and under real time ultrasound guidance: Advanced a 22-gauge spinal needle into the glenohumeral joint from a posterior approach, I then injected 1 cc kenalog 40, 2 cc lidocaine, 2 cc bupivacaine, syringe switched and 0.1 cc gadolinium injected, syringe again switched and 10 cc sterile saline used to fully distend the joint. ?Joint visualized and capsule seen distending confirming intra-articular placement of contrast material and medication. ?Completed without difficulty  ?Advised to call if fevers/chills, erythema, induration, drainage, or persistent bleeding.  ?Images permanently stored in PACS ?Impression: Technically successful ultrasound guided gadolinium contrast injection for MR arthrography.  Please see separate MR arthrogram report. ? ?Independent interpretation of notes and tests performed by another provider:  ? ?None. ? ?Brief History, Exam, Impression, and Recommendations:   ? ?Right shoulder pain ?Injection as above for MR arthrogram. ? ? ? ?___________________________________________ ?Ihor Austin. Benjamin Stain, M.D., ABFM., CAQSM. ?Primary Care and Sports Medicine ?[facility] ? ?Adjunct Instructor of Family Medicine  ?[employer] ?

## 2021-10-17 NOTE — Progress Notes (Signed)
The good news is you do not have a rotator cuff tear or a clear labrum tear.  You do have an anatomy that can be associated with instability.  If you feel as though your shoulder is constantly subluxing and you cannot get better with physical therapy we could refer you to surgery to talk about surgical options.  We could also refer you back to physical therapy to have a good trial of stability therapy.  Either I can refer you to orthopedic surgery to talk about surgical options or return to clinic to talk about the results of MRI.  Please let me know if you would like.

## 2021-10-18 ENCOUNTER — Encounter: Payer: Self-pay | Admitting: Physical Therapy

## 2021-10-24 NOTE — Progress Notes (Signed)
? ?I, Timothy Griffith, LAT, ATC, am serving as scribe for Dr. [name]. ? ?Timothy Griffith is a 26 y.o. male who presents to [place] at [facility] today for f/u of chronic R shoulder pain/instability and to review his R shoulder MRI.  He was last seen by Dr. Georgina Snell on 09/28/21 and due to long hx of R shoulder instability/subluxations following an initial shoulder subluxation on 04/04/16, he was referred for a R shoulder MRI.  Today, pt reports that his R shoulder is feeling the same as before. ? ?He had a good trial of physical therapy in 2017. ?His insurance will run out in September of this year ? ?Diagnostic testing: R shoulder MRI- 10/16/21; R shoulder XR- 09/28/21, 04/04/16 ? ?Pertinent review of systems: No fevers or chills ? ?Relevant historical information: Exercise asthma ? ? ?Exam:  ?BP 100/72 (BP Location: Left Arm, Patient Position: Sitting, Cuff Size: Normal)   Pulse (!) 56   Ht 6\' 1"  (1.854 m)   Wt 218 lb 6.4 oz (99.1 kg)   SpO2 98%   BMI 28.81 kg/m?  ?General: Well Developed, well nourished, and in no acute distress.  ? ?MSK: Right shoulder: Normal-appearing normal motion. ? ? ? ? ?Lab and Radiology Results ? ?EXAM: ?MR ARTHROGRAM OF THE right SHOULDER ?  ?TECHNIQUE: ?Multiplanar, multisequence MR imaging of the right shoulder was ?performed following the administration of intra-articular contrast. ?  ?CONTRAST:  See Injection Documentation. ?  ?COMPARISON:  Radiographs 09/28/2021 ?  ?FINDINGS: ?Despite efforts by the technologist and patient, motion artifact is ?present on today's exam and could not be eliminated. This reduces ?exam sensitivity and specificity. ?  ?Rotator cuff: Unremarkable ?  ?Muscles: Unremarkable ?  ?Biceps long head: Unremarkable ?  ?Acromioclavicular Joint: Unremarkable. Subacromial morphology is ?type 2 (curved). No regional bursitis. ?  ?Glenohumeral Joint: Intact articular cartilage. Anterior capsular ?attachment 1.1 cm from the labrum at the mid  equatorial line on ?image 12 series 3 compatible with type 3 capsular insertion which ?can be associated with instability. The glenohumeral ligaments ?appear intact. ?  ?Labrum: Unremarkable ?  ?Bones: No significant extra-articular osseous abnormalities ?identified. ?  ?IMPRESSION: ?1. Type 3 anterior capsular insertion which can be associated with ?instability. Otherwise unremarkable. ?  ?  ?Electronically Signed ?  By: Van Clines M.D. ?  On: 10/16/2021 15:44 ?  ?I, [name], personally (independently) visualized and performed the interpretation of the images attached in this note. ? ? ? ? ? ?Assessment and Plan: ?26 y.o. 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 ?Based on his MRI arthrogram results it appears that his instability may be due to his shoulder anatomy.  I do not know how improvable this is going to be with conservative management.  He did have a trial of physical therapy 2017.  It may be worth another try again or it may be worthwhile just proceeding to surgery.  Regardless he should have a surgical consultation to discuss his surgical options and to see if a retrial of physical therapy is worthwhile. ? ?Plan refer to surgery and recheck back if needed. ? ?Reviewed MRI results and images and discussed treatment plan and options. ? ?PDMP not reviewed this encounter. ?Orders Placed This Encounter  ?Procedures  ? Ambulatory referral to Orthopedic Surgery  ?  Referral Priority:   Routine  ?  Referral Type:   Surgical  ?  Referral Reason:   Specialty Services Required  ?  Referred to Provider:   Vanetta Mulders, MD  ?  Requested Specialty:   Orthopedic Surgery  ?  Number of Visits Requested:   1  ? ?No orders of the defined types were placed in this encounter. ? ? ? ?Discussed warning signs or symptoms. Please see discharge instructions. Patient expresses understanding. ? ?The above documentation has been reviewed and is accurate and complete [name], M.D. ? ?Total encounter time 20 minutes  including face-to-face time with the patient and, reviewing past medical record, and charting on the date of service.   ? ? ?

## 2021-10-25 ENCOUNTER — Encounter: Payer: Self-pay | Admitting: Family Medicine

## 2021-10-25 ENCOUNTER — Other Ambulatory Visit: Payer: Self-pay

## 2021-10-25 ENCOUNTER — Ambulatory Visit (INDEPENDENT_AMBULATORY_CARE_PROVIDER_SITE_OTHER): Payer: PRIVATE HEALTH INSURANCE | Admitting: Family Medicine

## 2021-10-25 VITALS — BP 100/72 | HR 56 | Ht 73.0 in | Wt 218.4 lb

## 2021-10-25 DIAGNOSIS — S43001S Unspecified subluxation of right shoulder joint, sequela: Secondary | ICD-10-CM

## 2021-10-25 DIAGNOSIS — G8929 Other chronic pain: Secondary | ICD-10-CM

## 2021-10-25 DIAGNOSIS — M25511 Pain in right shoulder: Secondary | ICD-10-CM

## 2021-10-25 NOTE — Patient Instructions (Addendum)
Good to see you today. ? ?I've referred you to Orthopedics.  Their office will call you to schedule but please let us know if you haven't heard from them in one week regarding scheduling. ? ?Follow-up: as needed ?

## 2021-10-27 ENCOUNTER — Ambulatory Visit (HOSPITAL_BASED_OUTPATIENT_CLINIC_OR_DEPARTMENT_OTHER): Payer: Self-pay | Admitting: Orthopaedic Surgery

## 2021-10-27 ENCOUNTER — Other Ambulatory Visit: Payer: Self-pay

## 2021-10-27 ENCOUNTER — Other Ambulatory Visit (HOSPITAL_BASED_OUTPATIENT_CLINIC_OR_DEPARTMENT_OTHER): Payer: Self-pay

## 2021-10-27 DIAGNOSIS — M25311 Other instability, right shoulder: Secondary | ICD-10-CM

## 2021-10-27 DIAGNOSIS — M25511 Pain in right shoulder: Secondary | ICD-10-CM

## 2021-10-27 MED ORDER — ACETAMINOPHEN 500 MG PO TABS
500.0000 mg | ORAL_TABLET | Freq: Three times a day (TID) | ORAL | 0 refills | Status: AC
  Filled 2021-10-27: qty 30, 10d supply, fill #0

## 2021-10-27 MED ORDER — ASPIRIN EC 325 MG PO TBEC
325.0000 mg | DELAYED_RELEASE_TABLET | Freq: Every day | ORAL | 0 refills | Status: DC
  Filled 2021-10-27: qty 30, 30d supply, fill #0

## 2021-10-27 MED ORDER — OXYCODONE HCL 5 MG PO TABS
5.0000 mg | ORAL_TABLET | ORAL | 0 refills | Status: DC | PRN
  Filled 2021-10-27: qty 20, 4d supply, fill #0

## 2021-10-27 MED ORDER — IBUPROFEN 800 MG PO TABS
800.0000 mg | ORAL_TABLET | Freq: Three times a day (TID) | ORAL | 0 refills | Status: AC
  Filled 2021-10-27: qty 30, 10d supply, fill #0

## 2021-10-27 NOTE — H&P (View-Only) (Signed)
? ?                            ? ? ?Chief Complaint: Right shoulder pain ?  ? ? ?History of Present Illness:  ? ? ?Timothy Griffith is a 26 y.o. male right-hand-dominant male presents with chronic shoulder instability since 2017.  At that time he was playing basketball and had the arm hit at which point he felt the shoulder subluxate forward.  This did not require any type of formal reduction.  Since that time he states that he has been very timid in terms of overhead activity in order to try to avoid this recurring.  He states that despite this he experiences subluxation episodes where the shoulder again feels like it is out of the socket approximately twice a year.  This happens this is painful and swollen for several days.  This last occurred in late 2022.  He presents today as he is interested in joining and will be applying for the [employer] and is somewhat hesitant on pursuing this without further addressing his shoulder.  He has previously done physical therapy for an extended duration after his initial subluxation episode but unfortunately did this did not improve his stability.  He continues to be very active and work out multiple times a week with part of this being a shoulder strengthening routine that he was given at therapy.  He has been performing home exercise taught during his previous therapy program after dislocation. Despite this he continues to have feeling of instability in the right shoulder. ? ? ? ?Surgical History:   ?None ? ?PMH/PSH/Family History/Social History/Meds/Allergies:   ? ?Past Medical History:  ?Diagnosis Date  ? Asthma   ? Environmental allergies   ? ?No past surgical history on file. ?Social History  ? ?Socioeconomic History  ? Marital status: Single  ?  Spouse name: Not on file  ? Number of children: Not on file  ? Years of education: Not on file  ? Highest education level: Not on file  ?Occupational History  ? Not on file  ?Tobacco Use  ? Smoking status: Never  ? Smokeless  tobacco: Never  ?Substance and Sexual Activity  ? Alcohol use: No  ? Drug use: No  ? Sexual activity: Never  ?Other Topics Concern  ? Not on file  ?Social History Narrative  ? Not on file  ? ?Social Determinants of Health  ? ?Financial Resource Strain: Not on file  ?Food Insecurity: Not on file  ?Transportation Needs: Not on file  ?Physical Activity: Not on file  ?Stress: Not on file  ?Social Connections: Not on file  ? ?Family History  ?Problem Relation Age of Onset  ? Hypertension Father   ? Hyperlipidemia Father   ? Hypertension Paternal Grandfather   ? Hyperlipidemia Paternal Grandfather   ? ?Allergies  ?Allergen Reactions  ? Cephalosporins   ? ?Current Outpatient Medications  ?Medication Sig Dispense Refill  ? albuterol (VENTOLIN HFA) 108 (90 Base) MCG/ACT inhaler Inhale 2 puffs into the lungs every 6 (six) hours as needed for wheezing. 1 each 3  ? ?No current facility-administered medications for this visit.  ? ?No results found. ? ?Review of Systems:   ?A ROS was performed including pertinent positives and negatives as documented in the HPI. ? ?Physical Exam :   ?Constitutional: NAD and appears stated age ?Neurological: Alert and oriented ?Psych: Appropriate affect and cooperative ?There were no vitals taken  for this visit.  ? ?Comprehensive Musculoskeletal Exam:   ? ?Musculoskeletal Exam    ?Inspection Right Left  ?Skin No atrophy or winging No atrophy or winging  ?Palpation    ?Tenderness Glenohumeral none  ?Range of Motion    ?Flexion (passive) 170 170  ?Flexion (active) 170 170  ?Abduction 170 170  ?ER at the side 70 70  ?Can reach behind back to T12 T12  ?Strength    ? Full Full  ?Special Tests    ?Pseudoparalytic No No  ?Neurologic    ?Fires PIN, radial, median, ulnar, musculocutaneous, axillary, suprascapular, long thoracic, and spinal accessory innervated muscles. No abnormal sensibility  ?Vascular/Lymphatic    ?Radial Pulse 2+ 2+  ?Cervical Exam    ?Patient has symmetric cervical range of motion with  negative Spurling's test.  ?Special Test: Positive anterior apprehension, 2+ anterior load shift with significant discomfort, 1+ posterior load shift  ? ? ? ?Imaging:   ?Xray (3 views right shoulder): ?Normal ? ?MRI (MRI right shoulder): ?There is evidence of an anterior inferior labral tear particularly as viewed on the sagittal T2 section. ? ?I personally reviewed and interpreted the radiographs. ? ? ?Assessment:   ?26 year old right-hand-dominant male with evidence of anterior right shoulder instability since 2017.  He is experiencing subluxation episodes approximately twice a month at this point.  He continues to be very active is very hesitant to do activities like playing basketball which he enjoys doing as result of fear of the shoulder subluxating.  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  At this time given the fact that he has trialed physical therapy for an extended duration of several months following his initial dislocation without any type of resolution of his symptoms, I do believe surgical intervention for labral repair would give him the best long-term outcome and allow him to be able to return to activities like basketball or police academy.  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 He has also been working on exercises given by PT at home to strengthen the shoulder with continued symptoms. After discussion of options, ee will plan at this time for right shoulder arthroscopy with arthroscopic labral repair. ? ?Plan :   ? ?-Plan for right shoulder arthroscopic labral repair ? ? ? ?After a lengthy discussion of treatment options, including risks,  benefits, alternatives, complications of surgical and nonsurgical conservative options, the patient elected surgical repair.  ? ?The patient  is aware of the material risks  and complications including, but not limited to injury to adjacent structures, neurovascular injury, infection, numbness, bleeding, implant failure, thermal burns, stiffness, persistent pain, failure to heal, disease transmission from allograft, need for further surgery, dislocation, anesthetic risks, blood clots, risks of death,and others. The probabilities of surgical success and failure discussed with patient given their particular co-morbidities.The time and nature of expected rehabilitation and recovery was discussed.The patient's questions were all answered preoperatively.  No barriers to understanding were noted. ?I explained the natural history of the disease process and Rx rationale.  I explained to the patient what I considered to be reasonable expectations given their personal situation.  The final treatment plan was  arrived at through a shared patient decision making process model. ? ? ? ? ? ?I personally saw and evaluated the patient, and participated in the management and treatment plan. ? ?Huel CoteSteven Remee Charley, MD ?Attending Physician, Orthopedic Surgery ? ?This document was dictated using Conservation officer, historic buildingsDragon voice recognition software. A reasonable attempt at proof reading has been made to minimize errors. ?

## 2021-10-27 NOTE — Progress Notes (Addendum)
? ?                            ? ? ?Chief Complaint: Right shoulder pain ?  ? ? ?History of Present Illness:  ? ? ?Timothy Griffith is a 26 y.o. male right-hand-dominant male presents with chronic shoulder instability since 2017.  At that time he was playing basketball and had the arm hit at which point he felt the shoulder subluxate forward.  This did not require any type of formal reduction.  Since that time he states that he has been very timid in terms of overhead activity in order to try to avoid this recurring.  He states that despite this he experiences subluxation episodes where the shoulder again feels like it is out of the socket approximately twice a year.  This happens this is painful and swollen for several days.  This last occurred in late 2022.  He presents today as he is interested in joining and will be applying for the [employer] and is somewhat hesitant on pursuing this without further addressing his shoulder.  He has previously done physical therapy for an extended duration after his initial subluxation episode but unfortunately did this did not improve his stability.  He continues to be very active and work out multiple times a week with part of this being a shoulder strengthening routine that he was given at therapy.  He has been performing home exercise taught during his previous therapy program after dislocation. Despite this he continues to have feeling of instability in the right shoulder. ? ? ? ?Surgical History:   ?None ? ?PMH/PSH/Family History/Social History/Meds/Allergies:   ? ?Past Medical History:  ?Diagnosis Date  ? Asthma   ? Environmental allergies   ? ?No past surgical history on file. ?Social History  ? ?Socioeconomic History  ? Marital status: Single  ?  Spouse name: Not on file  ? Number of children: Not on file  ? Years of education: Not on file  ? Highest education level: Not on file  ?Occupational History  ? Not on file  ?Tobacco Use  ? Smoking status: Never  ? Smokeless  tobacco: Never  ?Substance and Sexual Activity  ? Alcohol use: No  ? Drug use: No  ? Sexual activity: Never  ?Other Topics Concern  ? Not on file  ?Social History Narrative  ? Not on file  ? ?Social Determinants of Health  ? ?Financial Resource Strain: Not on file  ?Food Insecurity: Not on file  ?Transportation Needs: Not on file  ?Physical Activity: Not on file  ?Stress: Not on file  ?Social Connections: Not on file  ? ?Family History  ?Problem Relation Age of Onset  ? Hypertension Father   ? Hyperlipidemia Father   ? Hypertension Paternal Grandfather   ? Hyperlipidemia Paternal Grandfather   ? ?Allergies  ?Allergen Reactions  ? Cephalosporins   ? ?Current Outpatient Medications  ?Medication Sig Dispense Refill  ? albuterol (VENTOLIN HFA) 108 (90 Base) MCG/ACT inhaler Inhale 2 puffs into the lungs every 6 (six) hours as needed for wheezing. 1 each 3  ? ?No current facility-administered medications for this visit.  ? ?No results found. ? ?Review of Systems:   ?A ROS was performed including pertinent positives and negatives as documented in the HPI. ? ?Physical Exam :   ?Constitutional: NAD and appears stated age ?Neurological: Alert and oriented ?Psych: Appropriate affect and cooperative ?There were no vitals taken  for this visit.  ? ?Comprehensive Musculoskeletal Exam:   ? ?Musculoskeletal Exam    ?Inspection Right Left  ?Skin No atrophy or winging No atrophy or winging  ?Palpation    ?Tenderness Glenohumeral none  ?Range of Motion    ?Flexion (passive) 170 170  ?Flexion (active) 170 170  ?Abduction 170 170  ?ER at the side 70 70  ?Can reach behind back to T12 T12  ?Strength    ? Full Full  ?Special Tests    ?Pseudoparalytic No No  ?Neurologic    ?Fires PIN, radial, median, ulnar, musculocutaneous, axillary, suprascapular, long thoracic, and spinal accessory innervated muscles. No abnormal sensibility  ?Vascular/Lymphatic    ?Radial Pulse 2+ 2+  ?Cervical Exam    ?Patient has symmetric cervical range of motion with  negative Spurling's test.  ?Special Test: Positive anterior apprehension, 2+ anterior load shift with significant discomfort, 1+ posterior load shift  ? ? ? ?Imaging:   ?Xray (3 views right shoulder): ?Normal ? ?MRI (MRI right shoulder): ?There is evidence of an anterior inferior labral tear particularly as viewed on the sagittal T2 section. ? ?I personally reviewed and interpreted the radiographs. ? ? ?Assessment:   ?26 year old right-hand-dominant male with evidence of anterior right shoulder instability since 2017.  He is experiencing subluxation episodes approximately twice a month at this point.  He continues to be very active is very hesitant to do activities like playing basketball which he enjoys doing as result of fear of the shoulder subluxating.  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  At this time given the fact that he has trialed physical therapy for an extended duration of several months following his initial dislocation without any type of resolution of his symptoms, I do believe surgical intervention for labral repair would give him the best long-term outcome and allow him to be able to return to activities like basketball or police academy.  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 He has also been working on exercises given by PT at home to strengthen the shoulder with continued symptoms. After discussion of options, ee will plan at this time for right shoulder arthroscopy with arthroscopic labral repair. ? ?Plan :   ? ?-Plan for right shoulder arthroscopic labral repair ? ? ? ?After a lengthy discussion of treatment options, including risks,  benefits, alternatives, complications of surgical and nonsurgical conservative options, the patient elected surgical repair.  ? ?The patient  is aware of the material risks  and complications including, but not limited to injury to adjacent structures, neurovascular injury, infection, numbness, bleeding, implant failure, thermal burns, stiffness, persistent pain, failure to heal, disease transmission from allograft, need for further surgery, dislocation, anesthetic risks, blood clots, risks of death,and others. The probabilities of surgical success and failure discussed with patient given their particular co-morbidities.The time and nature of expected rehabilitation and recovery was discussed.The patient's questions were all answered preoperatively.  No barriers to understanding were noted. ?I explained the natural history of the disease process and Rx rationale.  I explained to the patient what I considered to be reasonable expectations given their personal situation.  The final treatment plan was  arrived at through a shared patient decision making process model. ? ? ? ? ? ?I personally saw and evaluated the patient, and participated in the management and treatment plan. ? ?Olie Dibert, MD ?Attending Physician, Orthopedic Surgery ? ?This document was dictated using Dragon voice recognition software. A reasonable attempt at proof reading has been made to minimize errors. ?

## 2021-11-06 ENCOUNTER — Other Ambulatory Visit: Payer: Self-pay

## 2021-11-06 ENCOUNTER — Encounter (HOSPITAL_COMMUNITY): Payer: Self-pay | Admitting: Orthopaedic Surgery

## 2021-11-06 NOTE — Pre-Procedure Instructions (Signed)
?  ?[id number] - [place] [place] - [address] AT [facility] ?[address] ?[place] [address] ?Phone: [phone] Fax: [phone] ? ?[id number] - [place],  - [address] ?[address] ?[place] [address] ?Phone: [phone] Fax: [phone] ? ?[facility]  ?[address] ?[place] [address] ?Phone: [phone] Fax: [phone] ? ?PCP - Ardith Dark, MD  ? ?[facility] - 0930 ? ?Anesthesia review: N ? ?Patient verbally denies any shortness of breath, fever, cough and chest pain during phone call ? ? ?-------------  SDW INSTRUCTIONS given: ? ?Your procedure is scheduled on 11/07/21. ? Report to [facility] Main Entrance "A" at 1000 A.M., and check in at the Admitting office. ? Call this number if you have problems the morning of surgery: ? [phone] ? ? Remember: ? Do not eat after midnight the night before your surgery ? ?You may drink clear liquids until 0930 the morning of your surgery.   ?Clear liquids allowed are: Water, Non-Citrus Juices (without pulp), Carbonated Beverages, Clear Tea, Black Coffee Only, and Gatorade ?  ? Take these medicines the morning of surgery with A SIP OF WATER  ?albuterol (VENTOLIN HFA)- If needed (please bring with you on day of surgery) ? ? ? ?As of today, STOP taking any Aspirin (unless otherwise instructed by your surgeon) Aleve, Naproxen, Ibuprofen, Motrin, Advil, Goody's, BC's, all herbal medications, fish oil, and all vitamins. ? ?         ?           Do not wear jewelry, make up, or nail polish ?           Do not wear lotions, powders, perfumes/colognes, or deodorant. ?           Do not shave 48 hours prior to surgery.  Men may shave face and neck. ?           Do not bring valuables to the hospital. ?           [facility] is not responsible for any belongings or valuables. ? ?Do NOT Smoke (Tobacco/Vaping) 24 hours prior to  your procedure ?If you use a CPAP at night, you may bring all equipment for your overnight stay. ?  ?Contacts, glasses, dentures or bridgework may not be worn into surgery.    ?  ?For patients admitted to the hospital, discharge time will be determined by your treatment team. ?  ?Patients discharged the day of surgery will not be allowed to drive home, and someone needs to stay with them for 24 hours. ? ? ? ?Special instructions:   ?[facility]- Preparing For Surgery ? ?Before surgery, you can play an important role. Because skin is not sterile, your skin needs to be as free of germs as possible. You can reduce the number of germs on your skin by washing with CHG (chlorahexidine gluconate) Soap before surgery.  CHG is an antiseptic cleaner which kills germs and bonds with the skin to continue killing germs even after washing.   ? ?Oral Hygiene is also important to reduce your risk of infection.  Remember - BRUSH YOUR TEETH THE MORNING OF SURGERY WITH YOUR REGULAR TOOTHPASTE ? ?Please do not use if you have an allergy to CHG or antibacterial soaps. If your skin becomes reddened/irritated stop using the CHG.  ?Do not shave (including legs and underarms) for at least 48 hours prior  to first CHG shower. It is OK to shave your face. ? ?Please follow these instructions carefully. ?  ?Shower the NIGHT BEFORE SURGERY and the MORNING OF SURGERY with DIAL Soap.  ? ?Pat yourself dry with a CLEAN TOWEL. ? ?Wear CLEAN PAJAMAS to bed the night before surgery ? ?Place CLEAN SHEETS on your bed the night of your first shower and DO NOT SLEEP WITH PETS. ? ? ?Day of Surgery: ?Please shower morning of surgery  ?Wear Clean/Comfortable clothing the morning of surgery ?Do not apply any deodorants/lotions.   ?Remember to brush your teeth WITH YOUR REGULAR TOOTHPASTE. ?  ?Questions were answered. Patient verbalized understanding of instructions.  ? ? ?   ? ?

## 2021-11-07 ENCOUNTER — Encounter (HOSPITAL_COMMUNITY)
Admission: RE | Disposition: A | Discharge status: Home / Self Care | Payer: Self-pay | Source: Ambulatory Visit | Attending: Orthopaedic Surgery

## 2021-11-07 ENCOUNTER — Ambulatory Visit (HOSPITAL_COMMUNITY): Payer: No Typology Code available for payment source | Admitting: Anesthesiology

## 2021-11-07 ENCOUNTER — Ambulatory Visit (HOSPITAL_COMMUNITY)
Admission: RE | Admit: 2021-11-07 | Discharge: 2021-11-07 | Disposition: A | Discharge status: Home / Self Care | Payer: No Typology Code available for payment source | Source: Ambulatory Visit | Attending: Orthopaedic Surgery | Admitting: Orthopaedic Surgery

## 2021-11-07 ENCOUNTER — Other Ambulatory Visit: Payer: Self-pay

## 2021-11-07 ENCOUNTER — Ambulatory Visit (HOSPITAL_BASED_OUTPATIENT_CLINIC_OR_DEPARTMENT_OTHER): Payer: No Typology Code available for payment source | Admitting: Anesthesiology

## 2021-11-07 ENCOUNTER — Encounter (HOSPITAL_COMMUNITY): Payer: Self-pay | Admitting: Orthopaedic Surgery

## 2021-11-07 DIAGNOSIS — J45909 Unspecified asthma, uncomplicated: Secondary | ICD-10-CM | POA: Insufficient documentation

## 2021-11-07 DIAGNOSIS — M25311 Other instability, right shoulder: Secondary | ICD-10-CM | POA: Diagnosis not present

## 2021-11-07 DIAGNOSIS — M25511 Pain in right shoulder: Secondary | ICD-10-CM | POA: Insufficient documentation

## 2021-11-07 DIAGNOSIS — S43431A Superior glenoid labrum lesion of right shoulder, initial encounter: Secondary | ICD-10-CM | POA: Diagnosis not present

## 2021-11-07 HISTORY — PX: SHOULDER ARTHROSCOPY WITH LABRAL REPAIR: SHX5691

## 2021-11-07 HISTORY — DX: COVID-19: U07.1

## 2021-11-07 SURGERY — ARTHROSCOPY, SHOULDER, WITH GLENOID LABRUM REPAIR
Anesthesia: Regional | Site: Shoulder | Laterality: Right

## 2021-11-07 MED ORDER — DEXMEDETOMIDINE (PRECEDEX) IN NS 20 MCG/5ML (4 MCG/ML) IV SYRINGE
PREFILLED_SYRINGE | INTRAVENOUS | Status: DC | PRN
  Administered 2021-11-07 (×2): 8 ug via INTRAVENOUS

## 2021-11-07 MED ORDER — ROCURONIUM BROMIDE 10 MG/ML (PF) SYRINGE
PREFILLED_SYRINGE | INTRAVENOUS | Status: DC | PRN
  Administered 2021-11-07: 100 mg via INTRAVENOUS

## 2021-11-07 MED ORDER — ROCURONIUM BROMIDE 10 MG/ML (PF) SYRINGE
PREFILLED_SYRINGE | INTRAVENOUS | Status: AC
  Filled 2021-11-07: qty 10

## 2021-11-07 MED ORDER — MIDAZOLAM HCL 2 MG/2ML IJ SOLN: 2.0000 mg | Freq: Once | INTRAMUSCULAR | Status: AC

## 2021-11-07 MED ORDER — AMISULPRIDE (ANTIEMETIC) 5 MG/2ML IV SOLN
10.0000 mg | Freq: Once | INTRAVENOUS | Status: AC | PRN
  Administered 2021-11-07: 10 mg via INTRAVENOUS

## 2021-11-07 MED ORDER — SUGAMMADEX SODIUM 200 MG/2ML IV SOLN
INTRAVENOUS | Status: DC | PRN
  Administered 2021-11-07 (×2): 100 mg via INTRAVENOUS

## 2021-11-07 MED ORDER — DEXAMETHASONE SODIUM PHOSPHATE 10 MG/ML IJ SOLN
INTRAMUSCULAR | Status: AC
  Filled 2021-11-07: qty 1

## 2021-11-07 MED ORDER — PROPOFOL 10 MG/ML IV BOLUS
INTRAVENOUS | Status: DC | PRN
  Administered 2021-11-07: 200 mg via INTRAVENOUS

## 2021-11-07 MED ORDER — FENTANYL CITRATE (PF) 100 MCG/2ML IJ SOLN: 100.0000 ug | Freq: Once | INTRAMUSCULAR | Status: AC

## 2021-11-07 MED ORDER — BUPIVACAINE HCL (PF) 0.5 % IJ SOLN
INTRAMUSCULAR | Status: DC | PRN
  Administered 2021-11-07: 10 mL via PERINEURAL

## 2021-11-07 MED ORDER — EPINEPHRINE PF 1 MG/ML IJ SOLN
INTRAMUSCULAR | Status: AC
  Filled 2021-11-07: qty 2

## 2021-11-07 MED ORDER — FENTANYL CITRATE (PF) 250 MCG/5ML IJ SOLN
INTRAMUSCULAR | Status: AC
  Filled 2021-11-07: qty 5

## 2021-11-07 MED ORDER — MEPERIDINE HCL 25 MG/ML IJ SOLN: 6.2500 mg | INTRAMUSCULAR | Status: DC | PRN

## 2021-11-07 MED ORDER — OXYCODONE HCL 5 MG PO TABS: 5.0000 mg | ORAL_TABLET | Freq: Once | ORAL | Status: DC | PRN

## 2021-11-07 MED ORDER — PROPOFOL 10 MG/ML IV BOLUS
INTRAVENOUS | Status: AC
  Filled 2021-11-07: qty 20

## 2021-11-07 MED ORDER — HYDROMORPHONE HCL 1 MG/ML IJ SOLN: 0.2500 mg | INTRAMUSCULAR | Status: DC | PRN

## 2021-11-07 MED ORDER — SODIUM CHLORIDE 0.9 % IR SOLN
Status: DC | PRN
  Administered 2021-11-07: 6000 mL

## 2021-11-07 MED ORDER — FENTANYL CITRATE (PF) 250 MCG/5ML IJ SOLN
INTRAMUSCULAR | Status: DC | PRN
Start: 2021-11-07 — End: 2021-11-07
  Administered 2021-11-07: 50 ug via INTRAVENOUS

## 2021-11-07 MED ORDER — ONDANSETRON HCL 4 MG/2ML IJ SOLN
4.0000 mg | Freq: Once | INTRAMUSCULAR | Status: AC | PRN
  Administered 2021-11-07: 4 mg via INTRAVENOUS

## 2021-11-07 MED ORDER — AMISULPRIDE (ANTIEMETIC) 5 MG/2ML IV SOLN
INTRAVENOUS | Status: AC
  Filled 2021-11-07: qty 4

## 2021-11-07 MED ORDER — LIDOCAINE 2% (20 MG/ML) 5 ML SYRINGE
INTRAMUSCULAR | Status: DC | PRN
  Administered 2021-11-07: 30 mg via INTRAVENOUS

## 2021-11-07 MED ORDER — GABAPENTIN 300 MG PO CAPS
300.0000 mg | ORAL_CAPSULE | Freq: Once | ORAL | Status: AC
  Administered 2021-11-07: 300 mg via ORAL
  Filled 2021-11-07: qty 1

## 2021-11-07 MED ORDER — CHLORHEXIDINE GLUCONATE 0.12 % MT SOLN: 15.0000 mL | Freq: Once | OROMUCOSAL | Status: AC

## 2021-11-07 MED ORDER — CEFAZOLIN SODIUM-DEXTROSE 2-4 GM/100ML-% IV SOLN
INTRAVENOUS | Status: AC
  Filled 2021-11-07: qty 100

## 2021-11-07 MED ORDER — ACETAMINOPHEN 500 MG PO TABS
1000.0000 mg | ORAL_TABLET | Freq: Once | ORAL | Status: AC
  Administered 2021-11-07: 1000 mg via ORAL
  Filled 2021-11-07: qty 2

## 2021-11-07 MED ORDER — BUPIVACAINE LIPOSOME 1.3 % IJ SUSP
INTRAMUSCULAR | Status: DC | PRN
  Administered 2021-11-07: 10 mL via PERINEURAL

## 2021-11-07 MED ORDER — DEXAMETHASONE SODIUM PHOSPHATE 10 MG/ML IJ SOLN
INTRAMUSCULAR | Status: DC | PRN
Start: 2021-11-07 — End: 2021-11-07
  Administered 2021-11-07: 5 mg via INTRAVENOUS

## 2021-11-07 MED ORDER — ONDANSETRON HCL 4 MG/2ML IJ SOLN
INTRAMUSCULAR | Status: AC
  Filled 2021-11-07: qty 2

## 2021-11-07 MED ORDER — ORAL CARE MOUTH RINSE: 15.0000 mL | Freq: Once | OROMUCOSAL | Status: AC

## 2021-11-07 MED ORDER — ONDANSETRON HCL 4 MG/2ML IJ SOLN
INTRAMUSCULAR | Status: DC | PRN
  Administered 2021-11-07: 4 mg via INTRAVENOUS

## 2021-11-07 MED ORDER — CEFAZOLIN SODIUM-DEXTROSE 2-4 GM/100ML-% IV SOLN: 2.0000 g | INTRAVENOUS | Status: DC

## 2021-11-07 MED ORDER — MIDAZOLAM HCL 2 MG/2ML IJ SOLN
INTRAMUSCULAR | Status: AC
  Administered 2021-11-07: 2 mg via INTRAVENOUS
  Filled 2021-11-07: qty 2

## 2021-11-07 MED ORDER — TRANEXAMIC ACID-NACL 1000-0.7 MG/100ML-% IV SOLN
1000.0000 mg | INTRAVENOUS | Status: AC
  Administered 2021-11-07: 1000 mg via INTRAVENOUS
  Filled 2021-11-07: qty 100

## 2021-11-07 MED ORDER — KETOROLAC TROMETHAMINE 30 MG/ML IJ SOLN: 30.0000 mg | Freq: Once | INTRAMUSCULAR | Status: DC | PRN

## 2021-11-07 MED ORDER — SODIUM CHLORIDE 0.9 % IR SOLN
Status: DC | PRN
  Administered 2021-11-07: 1000 mL

## 2021-11-07 MED ORDER — LIDOCAINE 2% (20 MG/ML) 5 ML SYRINGE
INTRAMUSCULAR | Status: AC
  Filled 2021-11-07: qty 5

## 2021-11-07 MED ORDER — CEFAZOLIN SODIUM-DEXTROSE 2-3 GM-%(50ML) IV SOLR
INTRAVENOUS | Status: DC | PRN
  Administered 2021-11-07: 2 g via INTRAVENOUS

## 2021-11-07 MED ORDER — LACTATED RINGERS IV SOLN: INTRAVENOUS | Status: DC

## 2021-11-07 MED ORDER — CHLORHEXIDINE GLUCONATE 0.12 % MT SOLN
OROMUCOSAL | Status: AC
  Administered 2021-11-07: 15 mL via OROMUCOSAL
  Filled 2021-11-07: qty 15

## 2021-11-07 MED ORDER — OXYCODONE HCL 5 MG/5ML PO SOLN: 5.0000 mg | Freq: Once | ORAL | Status: DC | PRN

## 2021-11-07 MED ORDER — FENTANYL CITRATE (PF) 100 MCG/2ML IJ SOLN
INTRAMUSCULAR | Status: AC
  Administered 2021-11-07: 100 ug via INTRAVENOUS
  Filled 2021-11-07: qty 2

## 2021-11-07 MED ORDER — DEXMEDETOMIDINE (PRECEDEX) IN NS 20 MCG/5ML (4 MCG/ML) IV SYRINGE
PREFILLED_SYRINGE | INTRAVENOUS | Status: AC
  Filled 2021-11-07: qty 5

## 2021-11-07 SURGICAL SUPPLY — 52 items
ANCHOR SUT 1.8 FIBERTAK SB KL (Anchor) ×3 IMPLANT
BAG COUNTER SPONGE SURGICOUNT (BAG) ×2 IMPLANT
BLADE EXCALIBUR 4.0X13 (MISCELLANEOUS) ×2 IMPLANT
BLADE SHAVER TORPEDO 4X13 (MISCELLANEOUS) ×2 IMPLANT
COVER SURGICAL LIGHT HANDLE (MISCELLANEOUS) ×2 IMPLANT
DISSECTOR 4.0MM X 13CM (MISCELLANEOUS) IMPLANT
DRAPE INCISE IOBAN 66X45 STRL (DRAPES) ×2 IMPLANT
DRAPE STERI 35X30 U-POUCH (DRAPES) ×4 IMPLANT
DRSG PAD ABDOMINAL 8X10 ST (GAUZE/BANDAGES/DRESSINGS) ×1 IMPLANT
DRSG TEGADERM 2-3/8X2-3/4 SM (GAUZE/BANDAGES/DRESSINGS) ×2 IMPLANT
DRSG TEGADERM 4X4.75 (GAUZE/BANDAGES/DRESSINGS) IMPLANT
DW OUTFLOW CASSETTE/TUBE SET (MISCELLANEOUS) ×2 IMPLANT
GAUZE SPONGE 4X4 12PLY STRL (GAUZE/BANDAGES/DRESSINGS) ×2 IMPLANT
GAUZE XEROFORM 1X8 LF (GAUZE/BANDAGES/DRESSINGS) ×2 IMPLANT
GLOVE SRG 8 PF TXTR STRL LF DI (GLOVE) ×1 IMPLANT
GLOVE SURG ENC MOIS LTX SZ6 (GLOVE) ×6 IMPLANT
GLOVE SURG LTX SZ8 (GLOVE) ×2 IMPLANT
GLOVE SURG SYN 7.5  E (GLOVE) ×6
GLOVE SURG SYN 7.5 E (GLOVE) ×3 IMPLANT
GLOVE SURG SYN 7.5 PF PI (GLOVE) ×3 IMPLANT
GLOVE SURG UNDER POLY LF SZ6.5 (GLOVE) ×4 IMPLANT
GLOVE SURG UNDER POLY LF SZ8 (GLOVE) ×2
GOWN STRL REUS W/ TWL LRG LVL3 (GOWN DISPOSABLE) ×2 IMPLANT
GOWN STRL REUS W/TWL LRG LVL3 (GOWN DISPOSABLE) ×4
KIT BASIN OR (CUSTOM PROCEDURE TRAY) ×2 IMPLANT
KIT CVD SPEAR FBRTK 1.8 DRILL (KITS) ×1 IMPLANT
KIT PERC INSERT 3.0 KNTLS (KITS) ×1 IMPLANT
KIT STR SPEAR 1.8 FBRTK DISP (KITS) ×2 IMPLANT
KIT TURNOVER KIT B (KITS) ×2 IMPLANT
LASSO CRESCENT QUICKPASS (SUTURE) IMPLANT
MANIFOLD NEPTUNE II (INSTRUMENTS) ×2 IMPLANT
NDL SPNL 18GX3.5 QUINCKE PK (NEEDLE) ×1 IMPLANT
NDL SPNL 22GX3.5 QUINCKE BK (NEEDLE) IMPLANT
NDL SUT 2-0 SCORPION KNEE (NEEDLE) ×1 IMPLANT
NEEDLE SPNL 18GX3.5 QUINCKE PK (NEEDLE) ×2 IMPLANT
NEEDLE SPNL 22GX3.5 QUINCKE BK (NEEDLE) ×2 IMPLANT
NEEDLE SUT 2-0 SCORPION KNEE (NEEDLE) ×2 IMPLANT
NS IRRIG 1000ML POUR BTL (IV SOLUTION) ×2 IMPLANT
PACK SHOULDER (CUSTOM PROCEDURE TRAY) ×2 IMPLANT
PAD ARMBOARD 7.5X6 YLW CONV (MISCELLANEOUS) ×4 IMPLANT
PORT APPOLLO RF 90DEGREE MULTI (SURGICAL WAND) IMPLANT
SPONGE T-LAP 4X18 ~~LOC~~+RFID (SPONGE) ×2 IMPLANT
SUT ETHILON 3 0 PS 1 (SUTURE) ×5 IMPLANT
SUT FIBERWIRE 2-0 18 17.9 3/8 (SUTURE)
SUT LASSO 90 DEG CVD (SUTURE) IMPLANT
SUTURE FIBERWR 2-0 18 17.9 3/8 (SUTURE) IMPLANT
SUTURE LASSO 90 DEG CURVED R (MISCELLANEOUS) ×1 IMPLANT
SYR 30ML LL (SYRINGE) ×2 IMPLANT
TOWEL GREEN STERILE (TOWEL DISPOSABLE) ×2 IMPLANT
TOWEL GREEN STERILE FF (TOWEL DISPOSABLE) ×2 IMPLANT
TUBING ARTHROSCOPY IRRIG 16FT (MISCELLANEOUS) ×2 IMPLANT
WATER STERILE IRR 1000ML POUR (IV SOLUTION) ×2 IMPLANT

## 2021-11-07 NOTE — Transfer of Care (Signed)
Immediate Anesthesia Transfer of Care Note ? ?Patient: Timothy Griffith ? ?Procedure(s) Performed: RIGHT SHOULDER ARTHROSCOPY WITH LABRAL REPAIR (Right: Shoulder) ? ?Patient Location: PACU ? ?Anesthesia Type:General and Regional ? ?Level of Consciousness: drowsy and patient cooperative ? ?Airway & Oxygen Therapy: Patient Spontanous Breathing and Patient connected to face mask oxygen ? ?Post-op Assessment: Report given to RN and Post -op Vital signs reviewed and stable ? ?Post vital signs: Reviewed and stable ? ?Last Vitals:  ?Vitals Value Taken Time  ?BP 123/83 11/07/21 1608  ?Temp    ?Pulse 71 11/07/21 1609  ?Resp 19 11/07/21 1609  ?SpO2 100 % 11/07/21 1609  ?Vitals shown include unvalidated device data. ? ?Last Pain:  ?Vitals:  ? 11/07/21 1055  ?TempSrc:   ?PainSc: 0-No pain  ?   ? ?  ? ?Complications: No notable events documented. ?

## 2021-11-07 NOTE — Anesthesia Procedure Notes (Signed)
Anesthesia Regional Block: Interscalene brachial plexus block  ? ?Pre-Anesthetic Checklist: , timeout performed,  Correct Patient, Correct Site, Correct Laterality,  Correct Procedure, Correct Position, site marked,  Risks and benefits discussed,  Surgical consent,  Pre-op evaluation,  At surgeon's request and post-op pain management ? ?Laterality: Right ? ?Prep: chloraprep     ?  ?Needles:  ?Injection technique: Single-shot ? ?Needle Type: Echogenic Stimulator Needle   ? ? ?Needle Length: 5cm  ?Needle Gauge: 22  ? ? ? ?Additional Needles: ? ? ?Procedures:, nerve stimulator,,, ultrasound used (permanent image in chart),,    ? ?Nerve Stimulator or Paresthesia:  ?Response: hand, 0.45 mA ? ?Additional Responses:  ? ?Narrative:  ?Start time: 11/07/2021 11:30 AM ?End time: 11/07/2021 11:35 AM ?Injection made incrementally with aspirations every 5 mL. ? ?Performed by: Personally  ?Anesthesiologist: Bethena Midget, MD ? ?Additional Notes: ?Functioning IV was confirmed and monitors were applied.  A 46mm 22ga Arrow echogenic stimulator needle was used. Sterile prep and drape,hand hygiene and sterile gloves were used. Ultrasound guidance: relevant anatomy identified, needle position confirmed, local anesthetic spread visualized around nerve(s)., vascular puncture avoided.  Image printed for medical record. Negative aspiration and negative test dose prior to incremental administration of local anesthetic. The patient tolerated the procedure well. ? ? ? ? ?

## 2021-11-07 NOTE — Op Note (Signed)
? ?Date of Surgery: 11/07/2021 ? ?INDICATIONS: Timothy Griffith is a 26 y.o.-year-old male with a right Bankart lesion as well as anterior labral tear with multiple recurrent episodes of instability.  He presents today for definitive labral fixation.  The risk and benefits of the procedure were discussed in detail and documented in the pre-operative evaluation.  ? ?PREOPERATIVE DIAGNOSIS: 1.  Right labral tear from 2 o'cock to 6 o'clock ? ?POSTOPERATIVE DIAGNOSIS: Same. ? ?PROCEDURE: 1.  Right arthroscopy with labral repair from 2 o'clock to 6 o'clock ? ?SURGEON: Timothy Deeds MD ? ?ASSISTANT: Recardo Evangelist ? ?ANESTHESIA:  general plus interscalene nerve block ? ?IV FLUIDS AND URINE: See anesthesia record. ? ?ANTIBIOTICS: Ancef 2 g ? ?ESTIMATED BLOOD LOSS: 5 mL. ? ?IMPLANTS:  ?Implant Name Type Inv. Item Serial No. Manufacturer Lot No. LRB No. Used Action  ?ANCHOR SUT 1.8 FIBERTAK SB KL - [id number] Anchor ANCHOR SUT 1.8 [employer] [id number] Right 2 Implanted  ? ? ?DRAINS: None ? ?CULTURES: None ? ?COMPLICATIONS: none ? ?DESCRIPTION OF PROCEDURE:  ?Examination under anesthesia revealed forward elevation of 165 degrees.  In abduction, there was 90 degrees of external rotation and 70 degrees of internal rotation.  With the arm at the side, there was 70 degrees of external rotation.  There is a 2+ anterior load shift and a 1+ posterior load shift.  8 mm of inferior humeral head ? ?Arthroscopic findings demonstrated: ? ?Glenoid cartilage: Normal ?Humeral head: Normal ?Labrum:  labral tear from 2 o'cock to 6 o'clock ?Biceps insertion: Intact ?Biceps tendon: Intact ?Subscapularis insertion: Normal ?Rotator cuff: Normal ? ?The patient was identified in the preoperative holding area.  The correct site was marked according to universal protocol.  Anesthesia performed an interscalene nerve block.  Ancef was given 1 hour prior to skin incision.  He is subsequently taken back to the operating room.  He was  prepped and draped and positioned in the lateral position.  All bony prominences were padded.  Final timeout was performed.  Standard posterior, anterior and anterosuperolateral portals were utilized. The posterior portal was created with an 11-blade and the arthroscope introduced into the glenohumeral joint.  A full diagnostic arthroscopy was performed as described above.  A low anterior portal just above the rolled border of the subscapularis was identified with a spinal needle, and then instrumenting cannula was placed.  An anterosuperolateral viewing portal was localized with a spinal needle just posterior to the biceps tendon, and the arthroscope was transferred to this portal.  A posterior portal was also placed for instrumentation. ? ?First, I directed my attention to preparation of the glenoid, labrum and capsule for repair.  The elevator was used to elevate off the injured labrum from the glenoid rim.  A shaver was subsequently introduced and used on forward in order to create bleeding bony bed for the labrum to heal back to. ? ?Next, I sequentially repaired the capsule and labrum from the 2:00 position to the 6:00 position with a total of 3 anchors.  These were all suture knotless anchors as noted above.  Anchors were placed at the 230, 430, 6:00 positions.  At each location, a pilot hole was drilled, the anchor inserted and deployed.  Then, one limb of suture was shuttled around the labrum and capsule using a suture lasso, taking care to provide both medial to lateral and inferior to superior shift of the tissues.  This was subsequently fed into the knotless mechanism and tensioned.  This was done sequentially for all additional anchors. ? ?Once completed, the labrum was restored to an anatomic position, and tension was restored to both bands of the IGHL.  The inferior capsular volume was normalized and the humeral head was centered on the glenoid.  ? ?All instruments were removed, fluid was evacuated,  and the arthroscopy portals were closed with 3-0 nylon.  A sterile dressing was applied with Xeroform, gauze, ABD and Medipore tape followed by a Iceman device and a sling with an abduction pillow.   ? ?The patient awoke from anesthesia without difficulty and was transferred to PACU in stable condition.    ? ? ? ?POSTOPERATIVE PLAN: He will be nonweightbearing in a sling.  He will come physical therapy.  He will advance per protocol.  I will see him back in 2 weeks for suture removal. ? ?Timothy Deeds, MD ?3:52 PM ? ? ? ?

## 2021-11-07 NOTE — Anesthesia Procedure Notes (Signed)
Procedure Name: Intubation ?Date/Time: 11/07/2021 1:59 PM ?Performed by: Janene Harvey, CRNA ?Pre-anesthesia Checklist: Patient identified, Emergency Drugs available, Suction available and Patient being monitored ?Patient Re-evaluated:Patient Re-evaluated prior to induction ?Oxygen Delivery Method: Circle system utilized ?Preoxygenation: Pre-oxygenation with 100% oxygen ?Induction Type: IV induction ?Ventilation: Mask ventilation without difficulty and Oral airway inserted - appropriate to patient size ?Laryngoscope Size: Mac and 4 ?Grade View: Grade I ?Tube type: Oral ?Tube size: 7.5 mm ?Number of attempts: 1 ?Airway Equipment and Method: Stylet and Oral airway ?Placement Confirmation: ETT inserted through vocal cords under direct vision, positive ETCO2 and breath sounds checked- equal and bilateral ?Secured at: 23 cm ?Tube secured with: Tape ?Dental Injury: Teeth and Oropharynx as per pre-operative assessment  ? ? ? ? ?

## 2021-11-07 NOTE — Interval H&P Note (Signed)
History and Physical Interval Note: ? ?11/07/2021 ?1:20 PM ? ?Timothy Griffith  has presented today for surgery, with the diagnosis of RIGHT SHOULDER INSTABILITY.  The various methods of treatment have been discussed with the patient and family. After consideration of risks, benefits and other options for treatment, the patient has consented to  Procedure(s): ?RIGHT SHOULDER ARTHROSCOPY WITH LABRAL REPAIR (Right) as a surgical intervention.  The patient's history has been reviewed, patient examined, no change in status, stable for surgery.  I have reviewed the patient's chart and labs.  Questions were answered to the patient's satisfaction.   ? ? ?[name] ? ? ?

## 2021-11-07 NOTE — Brief Op Note (Signed)
? ?  Brief Op Note ? ?Date of Surgery: ?11/07/2021 ? ?Preoperative Diagnosis: ?RIGHT SHOULDER INSTABILITY ? ?Postoperative Diagnosis: ?same ? ?Procedure: ?Procedure(s): ?RIGHT SHOULDER ARTHROSCOPY WITH LABRAL REPAIR ? ?Implants: ?Implant Name Type Inv. Item Serial No. Manufacturer Lot No. LRB No. Used Action  ?ANCHOR SUT 1.8 FIB[employer]AK SB KL - [id number] Anchor ANCHOR SUT 1.8 [employer] [id number] Right 2 Implanted  ? ? ?Surgeons: ?Surgeon(s): ?Vanetta Mulders, MD ? ?Anesthesia: ?Regional ? ? ? ?Estimated Blood Loss: ?See anesthesia record ? ?Complications: ?None ? ?Condition to PACU: ?Stable ? ?Yevonne Pax, MD ?11/07/2021 ?3:52 PM ? ?

## 2021-11-07 NOTE — Anesthesia Postprocedure Evaluation (Signed)
Anesthesia Post Note ? ?Patient: Timothy Griffith ? ?Procedure(s) Performed: RIGHT SHOULDER ARTHROSCOPY WITH LABRAL REPAIR (Right: Shoulder) ? ?  ? ?Patient location during evaluation: PACU ?Anesthesia Type: Regional and General ?Level of consciousness: sedated ?Pain management: pain level controlled ?Vital Signs Assessment: post-procedure vital signs reviewed and stable ?Respiratory status: spontaneous breathing and respiratory function stable ?Cardiovascular status: stable ?Postop Assessment: no apparent nausea or vomiting ?Anesthetic complications: no ? ? ?No notable events documented. ? ?Last Vitals:  ?Vitals:  ? 11/07/21 1623 11/07/21 1638  ?BP: 125/78 (!) 136/91  ?Pulse: 71 67  ?Resp: 12 15  ?Temp:  (!) 36.4 ?C  ?SpO2: 95% 100%  ?  ?Last Pain:  ?Vitals:  ? 11/07/21 1638  ?TempSrc:   ?PainSc: 0-No pain  ? ? ?  ?  ?  ?  ?  ?  ? ?[name] DANIEL ? ? ? ? ?

## 2021-11-07 NOTE — Discharge Instructions (Signed)
? ? ?   Discharge Instructions  ? ? ?Attending Surgeon: Vanetta Mulders, MD ?Office Phone Number: [phone] ? ? ?Diagnosis and Procedures:   ? ?Surgeries Performed: ?Right shoulder arthroscopic labral repair ? ?Discharge Plan:  ? ? ?Diet: ?Resume usual diet. Begin with light or bland foods.  Drink plenty of fluids. ? ?Activity:  ?Keep sling and dressing in place until your follow up visit in Physical Therapy ?You are advised to go home directly from the hospital or surgical center. Restrict your activities. ? ?GENERAL INSTRUCTIONS: ?1.  Keep your surgical site elevated above your heart for at least 5-7 days or longer to prevent swelling. This will improve your comfort and your overall recovery following surgery.   ?  ?2. Please call Dr. Eddie Dibbles office at [phone] with questions Monday-Friday during business hours. If no one answers, please leave a message and someone should get back to the patient within 24 hours. For emergencies please call 911 or proceed to the emergency room.  ? ?3. Patient to notify surgical team if experiences any of the following: Bowel/Bladder dysfunction, uncontrolled pain, nerve/muscle weakness, incision with increased drainage or redness, nausea/vomiting and Fever greater than 101.0 F.  Be alert for signs of infection including redness, streaking, odor, fever or chills. Be alert for excessive pain or bleeding and notify your surgeon immediately. ? ?WOUND INSTRUCTIONS:   ?Leave your dressing/cast/splint in place until your post operative visit.  Keep it clean and dry. ? ?Always keep the incision clean and dry until the staples/sutures are removed. If there is no drainage from the incision you should keep it open to air. If there is drainage from the incision you must keep it covered at all times until the drainage stops ? Do not soak in a bath tub, hot tub, pool, lake or other body of water until 21 days after your surgery and your incision is completely dry and healed.  ?If you  have removable sutures (or staples) they must be removed 10-14 days (unless otherwise instructed) from the day of your surgery.  ? ? ? 1)  Elevate the extremity as much as possible. ? 2)  Keep the dressing clean and dry. ? 3)  Please call us if the dressing becomes wet or dirty. ? 4)  If you are experiencing worsening pain or worsening swelling, please call. ?  ?  ?MEDICATIONS: ?Resume all previous home medications at the previous prescribed dose and frequency unless otherwise noted ?Start taking the  pain medications on an as-needed basis as prescribed  ?Please taper down pain medication over the next week following surgery.  Ideally you should not require a refill of any narcotic pain medication.  ?Take pain medication with food to minimize nausea. ?In addition to the prescribed pain medication, you may take over-the-counter pain relievers such as Tylenol.  Do NOT take additional tylenol if your pain medication already has tylenol in it.  ?Aspirin 325mg  daily for four weeks. ? ?  ?  ?FOLLOWUP INSTRUCTIONS: ?1. Follow up at the Physical Therapy Clinic 3-4 days following surgery. This appointment should be scheduled unless other arrangements have been made.The Physical Therapy scheduling number is [phone] if an appointment has not already been arranged. ? ?2. Contact Dr. Eddie Dibbles office during office hours at [phone] or the practice after hours line at [phone] for non-emergencies. For medical emergencies call 911. ? ? ?Discharge Location: Home  ?

## 2021-11-07 NOTE — Anesthesia Preprocedure Evaluation (Addendum)
Anesthesia Evaluation  ?Patient identified by MRN, date of birth, ID band ?Patient awake ? ? ? ?Reviewed: ?Allergy & Precautions, NPO status , Patient's Chart, lab work & pertinent test results ? ?Airway ?Mallampati: I ? ?TM Distance: >3 FB ?Neck ROM: Full ? ? ? Dental ?no notable dental hx. ?(+) Teeth Intact, Dental Advisory Given ?  ?Pulmonary ?neg pulmonary ROS, asthma ,  ?  ?Pulmonary exam normal ?breath sounds clear to auscultation ? ? ? ? ? ? Cardiovascular ?negative cardio ROS ?Normal cardiovascular exam ?Rhythm:Regular Rate:Normal ? ? ?  ?Neuro/Psych ?negative neurological ROS ? negative psych ROS  ? GI/Hepatic ?negative GI ROS, Neg liver ROS,   ?Endo/Other  ?negative endocrine ROS ? Renal/GU ?negative Renal ROS  ?negative genitourinary ?  ?Musculoskeletal ?negative musculoskeletal ROS ?(+)  ? Abdominal ?  ?Peds ?negative pediatric ROS ?(+)  Hematology ?negative hematology ROS ?(+)   ?Anesthesia Other Findings ? ? Reproductive/Obstetrics ?negative OB ROS ?Right shoulder instability ? ?  ? ? ? ? ? ? ? ? ? ? ? ? ? ?  ?  ? ? ? ? ? ? ? ?Anesthesia Physical ?Anesthesia Plan ? ?ASA: 1 ? ?Anesthesia Plan: General and Regional  ? ?Post-op Pain Management: Regional block*  ? ?Induction: Intravenous ? ?PONV Risk Score and Plan: 2 and Ondansetron, Dexamethasone, Midazolam and Treatment may vary due to age or medical condition ? ?Airway Management Planned: Oral ETT ? ?Additional Equipment: None ? ?Intra-op Plan:  ? ?Post-operative Plan: Extubation in OR ? ?Informed Consent:  ? ?Plan Discussed with: Anesthesiologist and CRNA ? ?Anesthesia Plan Comments:   ? ? ? ? ? ?Anesthesia Quick Evaluation ? ?

## 2021-11-08 ENCOUNTER — Encounter (HOSPITAL_COMMUNITY): Payer: Self-pay | Admitting: Orthopaedic Surgery

## 2021-11-13 ENCOUNTER — Ambulatory Visit (HOSPITAL_BASED_OUTPATIENT_CLINIC_OR_DEPARTMENT_OTHER): Payer: No Typology Code available for payment source | Attending: Orthopaedic Surgery | Admitting: Physical Therapy

## 2021-11-13 ENCOUNTER — Encounter (HOSPITAL_BASED_OUTPATIENT_CLINIC_OR_DEPARTMENT_OTHER): Payer: Self-pay | Admitting: Physical Therapy

## 2021-11-13 DIAGNOSIS — M25611 Stiffness of right shoulder, not elsewhere classified: Secondary | ICD-10-CM | POA: Insufficient documentation

## 2021-11-13 DIAGNOSIS — M6281 Muscle weakness (generalized): Secondary | ICD-10-CM | POA: Insufficient documentation

## 2021-11-13 DIAGNOSIS — M25511 Pain in right shoulder: Secondary | ICD-10-CM | POA: Diagnosis present

## 2021-11-13 NOTE — Therapy (Signed)
?OUTPATIENT PHYSICAL THERAPY SHOULDER EVALUATION ? ? ?Patient Name: Timothy Griffith ?MRN: [id number] ?DOB:[birth year], 26 y.o., male ?Today's Date: 11/13/2021 ? ? PT End of Session - 11/13/21 1349   ? ? Visit Number 1   ? Number of Visits 25   ? Date for PT Re-Evaluation 02/09/22   ? Authorization Type [employer]   ? PT Start Time 1347   ? PT Stop Time 1420   ? PT Time Calculation (min) 33 min   ? Activity Tolerance Patient tolerated treatment well   ? Behavior During Therapy [facility] for tasks assessed/performed   ? ?  ?  ? ?  ? ? ?Past Medical History:  ?Diagnosis Date  ? Asthma   ? COVID-19   ? Jan 2021 and Jan 2022  ? Environmental allergies   ? ?Past Surgical History:  ?Procedure Laterality Date  ? SHOULDER ARTHROSCOPY WITH LABRAL REPAIR Right 11/07/2021  ? Procedure: RIGHT SHOULDER ARTHROSCOPY WITH LABRAL REPAIR;  Surgeon: [name], MD;  Location: [facility] OR;  Service: Orthopedics;  Laterality: Right;  ? ?Patient Active Problem List  ? Diagnosis Date Noted  ? Right shoulder pain 09/25/2021  ? Exercise-induced asthma 06/03/2012  ? Scoliosis 06/03/2012  ? ADD (attention deficit disorder with hyperactivity) 09/16/2011  ? ? ?PCP: Ardith Dark, MD ? ?REFERRING PROVIDER: [name], MD ? ?REFERRING DIAG:  ? ?THERAPY DIAG:  ?Acute pain of right shoulder ? ?Stiffness of right shoulder, not elsewhere classified ? ?Muscle weakness (generalized) ? ? ?ONSET DATE: initial injury in 2017; Rt labral repair on 4/4 ? ?SUBJECTIVE:                                                                                                                                                                                     ? ?SUBJECTIVE STATEMENT: ?Not feeling too bad. Hurting in both shoulders prior to surgery.  ? ?PERTINENT HISTORY: ?asthma ? ?PAIN:  ?Are you having pain? No ? ?PRECAUTIONS: Other: labral repair ? ?WEIGHT BEARING RESTRICTIONS Yes shoulder ? ?FALLS:  ?Has patient fallen in last 6 months? No ? ?LIVING ENVIRONMENT: ?Lives with: lives  with their family ? ?OCCUPATION: ?Not working right now ? ?PLOF: Independent ? ?PATIENT GOALS playing sports ? ?OBJECTIVE:  ? ? ?PATIENT SURVEYS:  ?FOTO 22 ? ?COGNITION: ? Overall cognitive status: Within functional limits for tasks assessed ?    ?SENSATION: ?WFL ? ?POSTURE: ?Forward rounded shoulders as expected post op ? ?UPPER EXTREMITY ROM:  ? ?Passive ROM Right ?11/13/2021 Left ?11/13/2021  ?Shoulder flexion    ?Shoulder extension    ?Shoulder abduction    ?Shoulder adduction    ?Shoulder internal rotation    ?Shoulder external  rotation    ?Elbow flexion    ?Elbow extension    ?Wrist flexion    ?Wrist extension    ?Wrist ulnar deviation    ?Wrist radial deviation    ?Wrist pronation    ?Wrist supination    ?(Blank rows = not tested) ? ?UPPER EXTREMITY MMT: 4/10 pt was very guarded in motion, was able to passively flex to 45 per visual inspection ? ?MMT Right ?11/13/2021   ?Shoulder flexion    ?Shoulder extension    ?Shoulder abduction    ?Shoulder adduction    ?Shoulder internal rotation    ?Shoulder external rotation    ?Middle trapezius    ?Lower trapezius    ?Elbow flexion    ?Elbow extension    ?Wrist flexion    ?Wrist extension    ?Wrist ulnar deviation    ?Wrist radial deviation    ?Wrist pronation    ?Wrist supination    ?Grip strength (lbs)    ?(Blank rows = not tested) ? ? ?  ?TODAY'S TREATMENT:  ?4/10: ?PT replaced bandages- no s/s of infection ?Passive flexion ?Scapular retraction ?Biceps AAROM ?Wrist motion/grip with putty ? ? ?PATIENT EDUCATION: ?Education details: Anatomy of condition, POC, HEP, exercise form/rationale ? ?Person educated: Patient ?Education method: Explanation, Demonstration, Tactile cues, Verbal cues, and Handouts ?Education comprehension: verbalized understanding, returned demonstration, verbal cues required, tactile cues required, and needs further education ? ? ?HOME EXERCISE PROGRAM: ?[id number] ? ?ASSESSMENT: ? ?CLINICAL IMPRESSION: ?Patient is a 26 y.o. M who was seen today for  physical therapy evaluation and treatment for s/p Rt Labral repair on 11/07/21.  ? ? ?OBJECTIVE IMPAIRMENTS decreased activity tolerance, decreased ROM, decreased strength, increased muscle spasms, impaired flexibility, impaired UE functional use, improper body mechanics, postural dysfunction, and pain.  ? ?ACTIVITY LIMITATIONS cleaning, community activity, driving, meal prep, and yard work.  ? ?PERSONAL FACTORS 1-2 comorbidities: asthma, bilateral shoulder pain  are also affecting patient's functional outcome.  ? ? ?REHAB POTENTIAL: Good ? ?CLINICAL DECISION MAKING: Stable/uncomplicated ? ?EVALUATION COMPLEXITY: Low ? ? ?GOALS: ?Goals reviewed with patient? Yes ? ?SHORT TERM GOALS: Target date: 12/12/2021 ? ?Passive flexion to 120 ?Baseline: ?Goal status: INITIAL ? ?2.  Proper activation of scapular retraction ?Baseline:  ?Goal status: INITIAL ? ? ?LONG TERM GOALS: Target date: 02/06/2022 ? ?Able to maneuver at least 5lb through full ROM  ?Baseline:  ?Goal status: INITIAL ? ?2.  GHJ strength to 75% of opp UE via tindeq/dynamometry testing ?Baseline:  ?Goal status: INITIAL ? ?3.  Pt will meet FOTO goal ?Baseline:  ?Goal status: INITIAL ? ?4.  Further goals to be set regarding return to sport at this time ? ? ?PLAN: ?PT FREQUENCY: 1-2x/week ? ?PT DURATION: 12 weeks ? ?PLANNED INTERVENTIONS: Therapeutic exercises, Therapeutic activity, Neuromuscular re-education, Patient/Family education, Joint mobilization, Aquatic Therapy, Dry Needling, Electrical stimulation, Spinal mobilization, Cryotherapy, Moist heat, Taping, Traction, and Manual therapy ? ?PLAN FOR NEXT SESSION: continue PROM per protocol, scap mobility ? ? ?Aarit Kashuba C. Shya Kovatch PT, DPT ?11/14/21 11:56 AM ? ?

## 2021-11-20 ENCOUNTER — Encounter (HOSPITAL_BASED_OUTPATIENT_CLINIC_OR_DEPARTMENT_OTHER): Payer: Self-pay | Admitting: Physical Therapy

## 2021-11-20 ENCOUNTER — Ambulatory Visit (HOSPITAL_BASED_OUTPATIENT_CLINIC_OR_DEPARTMENT_OTHER): Payer: No Typology Code available for payment source | Admitting: Physical Therapy

## 2021-11-20 DIAGNOSIS — M25511 Pain in right shoulder: Secondary | ICD-10-CM

## 2021-11-20 DIAGNOSIS — M6281 Muscle weakness (generalized): Secondary | ICD-10-CM

## 2021-11-20 DIAGNOSIS — M25611 Stiffness of right shoulder, not elsewhere classified: Secondary | ICD-10-CM

## 2021-11-20 NOTE — Therapy (Signed)
?OUTPATIENT PHYSICAL THERAPY SHOULDER Treatment  ? ?Patient Name: Timothy LoftsRobert N Griffith ?MRN: [id number] ?DOB:28-Sep-1995, 26 y.o., male ?Today's Date: 11/20/2021 ? ? PT End of Session - 11/20/21 1027   ? ? Visit Number 2   ? Number of Visits 25   ? Date for PT Re-Evaluation 02/09/22   ? Authorization Type [employer]etna   ? PT Start Time 0845   ? PT Stop Time 0925   ? PT Time Calculation (min) 40 min   ? Activity Tolerance Patient tolerated treatment well   ? [facility] assessed/performed   ? ?  ?  ? ?  ? ? ? ?Past Medical History:  ?Diagnosis Date  ? Asthma   ? COVID-19   ? Jan 2021 and Jan 2022  ? Environmental allergies   ? ?Past Surgical History:  ?Procedure Laterality Date  ? SHOULDER ARTHROSCOPY WITH LABRAL REPAIR Right 11/07/2021  ? Procedure: RIGHT SHOULDER ARTHROSCOPY WITH LABRAL REPAIR;  Surgeon: Huel CoteBokshan, Steven, MD;  Location: [facility] OR;  Service: Orthopedics;  Laterality: Right;  ? ?Patient Active Problem List  ? Diagnosis Date Noted  ? Right shoulder pain 09/25/2021  ? Exercise-induced asthma 06/03/2012  ? Scoliosis 06/03/2012  ? ADD (attention deficit disorder with hyperactivity) 09/16/2011  ? ? ?PCP: Ardith DarkParker, Caleb M, MD ? ?REFERRING PROVIDER:Dr Huel CoteSteven Bokshan  ? ?REFERRING DIAG:  ? ?THERAPY DIAG:  ?Acute pain of right shoulder ? ?Stiffness of right shoulder, not elsewhere classified ? ?Muscle weakness (generalized) ? ? ?ONSET DATE: initial injury in 2017; Rt labral repair on 4/4 ? ?SUBJECTIVE:                                                                                                                                                                                     ? ?SUBJECTIVE STATEMENT: ?The patient has not complaints this morning. He reports no pain. He has been sleeping well.  ?PERTINENT HISTORY: ?asthma ? ?PAIN:  ?Are you having pain? No ? ?PRECAUTIONS: Other: labral repair ? ?WEIGHT BEARING RESTRICTIONS Yes shoulder ? ?FALLS:  ?Has patient fallen in last 6 months? No ? ?LIVING  ENVIRONMENT: ?Lives with: lives with their family ? ?OCCUPATION: ?Not working right now ? ?PLOF: Independent ? ?PATIENT GOALS playing sports ? ?OBJECTIVE:  ? ? ?PATIENT SURVEYS:  ?FOTO 22 ? ?COGNITION: ? Overall cognitive status: Within functional limits for tasks assessed ?    ?SENSATION: ?WFL ? ?POSTURE: ?Forward rounded shoulders as expected post op ? ?UPPER EXTREMITY ROM:  ? ?Passive ROM Right ?11/20/2021 Left ?11/20/2021  ?Shoulder flexion  95  ?Shoulder extension    ?Shoulder abduction    ?Shoulder adduction    ?Shoulder internal rotation    ?  Shoulder external rotation  Neutral   ?Elbow flexion    ?Elbow extension    ?Wrist flexion    ?Wrist extension    ?Wrist ulnar deviation    ?Wrist radial deviation    ?Wrist pronation    ?Wrist supination    ?(Blank rows = not tested) ? ?UPPER EXTREMITY MMT: 4/10 pt was very guarded in motion, was able to passively flex to 45 per visual inspection ? ?MMT Right ?11/20/2021   ?Shoulder flexion    ?Shoulder extension    ?Shoulder abduction    ?Shoulder adduction    ?Shoulder internal rotation    ?Shoulder external rotation    ?Middle trapezius    ?Lower trapezius    ?Elbow flexion    ?Elbow extension    ?Wrist flexion    ?Wrist extension    ?Wrist ulnar deviation    ?Wrist radial deviation    ?Wrist pronation    ?Wrist supination    ?Grip strength (lbs)    ?(Blank rows = not tested) ? ? ?  ?TODAY'S TREATMENT:  ?4/17 ?Manual: gentle PROM into ER and flexion  ?Trigger point release to upper trap  ? ?AAROM:  ? ?Self flexion x5 with cuing for range and hand placement  ?AAROM ER x10 5 sec hold  ? ?Sling isos  ?IR ER and flexion with min cuing. Good technique noted 10x 5 sec hold.  ? ?4/10: ?PT replaced bandages- no s/s of infection ?Passive flexion ?Scapular retraction ?Biceps AAROM ?Wrist motion/grip with putty ? ? ?PATIENT EDUCATION: ?Education details: Anatomy of condition, POC, HEP, exercise form/rationale ? ?Person educated: Patient ?Education method: Explanation,  Demonstration, Tactile cues, Verbal cues, and Handouts ?Education comprehension: verbalized understanding, returned demonstration, verbal cues required, tactile cues required, and needs further education ? ? ?HOME EXERCISE PROGRAM: ?[id number] ? ?ASSESSMENT: ? ?CLINICAL IMPRESSION: ?The patient is 2 eeks post op. He tolerated treatment well. His motion was limited at first but opened up with manual therapy. His flexion was just past 90 degrees after PROM. His ER was limited. He was given light AAROM to work on at home. He was also given sling isometrics for home. He has no significant increase in pain with treatment. We will advance at tolerate.  ? ? ?OBJECTIVE IMPAIRMENTS decreased activity tolerance, decreased ROM, decreased strength, increased muscle spasms, impaired flexibility, impaired UE functional use, improper body mechanics, postural dysfunction, and pain.  ? ?ACTIVITY LIMITATIONS cleaning, community activity, driving, meal prep, and yard work.  ? ?PERSONAL FACTORS 1-2 comorbidities: asthma, bilateral shoulder pain  are also affecting patient's functional outcome.  ? ? ?REHAB POTENTIAL: Good ? ?CLINICAL DECISION MAKING: Stable/uncomplicated ? ?EVALUATION COMPLEXITY: Low ? ? ?GOALS: ?Goals reviewed with patient? Yes ? ?SHORT TERM GOALS: Target date: 12/12/2021 ? ?Passive flexion to 120 ?Baseline: ?Goal status: INITIAL ? ?2.  Proper activation of scapular retraction ?Baseline:  ?Goal status: INITIAL ? ? ?LONG TERM GOALS: Target date: 02/06/2022 ? ?Able to maneuver at least 5lb through full ROM  ?Baseline:  ?Goal status: INITIAL ? ?2.  GHJ strength to 75% of opp UE via tindeq/dynamometry testing ?Baseline:  ?Goal status: INITIAL ? ?3.  Pt will meet FOTO goal ?Baseline:  ?Goal status: INITIAL ? ?4.  Further goals to be set regarding return to sport at this time ? ? ?PLAN: ?PT FREQUENCY: 1-2x/week ? ?PT DURATION: 12 weeks ? ?PLANNED INTERVENTIONS: Therapeutic exercises, Therapeutic activity, Neuromuscular  re-education, Patient/Family education, Joint mobilization, Aquatic Therapy, Dry Needling, Electrical stimulation, Spinal mobilization, Cryotherapy, Moist heat, Taping, Traction,  and Manual therapy ? ?PLAN FOR NEXT SESSION: continue PROM per protocol, scap mobility; review isometrics and AAROM. Continue with PROM in all planes.  ? ?Lorayne Bender PT DPT  ?11/20/21 10:28 AM ? ?

## 2021-11-22 ENCOUNTER — Ambulatory Visit (INDEPENDENT_AMBULATORY_CARE_PROVIDER_SITE_OTHER): Payer: No Typology Code available for payment source | Admitting: Orthopaedic Surgery

## 2021-11-22 DIAGNOSIS — M25511 Pain in right shoulder: Secondary | ICD-10-CM

## 2021-11-22 NOTE — Progress Notes (Signed)
? ?                            ? ? ?Post Operative Evaluation ?  ? ?Procedure/Date of Surgery: right shoulder labral repair 11/07/21 ? ?Interval History:  ? ?For follow-up 2 weeks status post right shoulder labral repair.  Overall he is doing very well.  He has not had any pain.  He has been compliant with taking his aspirin.  He has gone to physical therapy.  He has been compliant with sling usage. ? ? ?PMH/PSH/Family History/Social History/Meds/Allergies:   ? ?Past Medical History:  ?Diagnosis Date  ? Asthma   ? COVID-19   ? Jan 2021 and Jan 2022  ? Environmental allergies   ? ?Past Surgical History:  ?Procedure Laterality Date  ? SHOULDER ARTHROSCOPY WITH LABRAL REPAIR Right 11/07/2021  ? Procedure: RIGHT SHOULDER ARTHROSCOPY WITH LABRAL REPAIR;  Surgeon: Vanetta Mulders, MD;  Location: [facility];  Service: Orthopedics;  Laterality: Right;  ? ?Social History  ? ?Socioeconomic History  ? Marital status: Single  ?  Spouse name: Not on file  ? Number of children: Not on file  ? Years of education: Not on file  ? Highest education level: Not on file  ?Occupational History  ? Not on file  ?Tobacco Use  ? Smoking status: Never  ? Smokeless tobacco: Never  ?Substance and Sexual Activity  ? Alcohol use: No  ? Drug use: No  ? Sexual activity: Never  ?Other Topics Concern  ? Not on file  ?Social History Narrative  ? Not on file  ? ?Social Determinants of Health  ? ?Financial Resource Strain: Not on file  ?Food Insecurity: Not on file  ?Transportation Needs: Not on file  ?Physical Activity: Not on file  ?Stress: Not on file  ?Social Connections: Not on file  ? ?Family History  ?Problem Relation Age of Onset  ? Hypertension Father   ? Hyperlipidemia Father   ? Hypertension Paternal Grandfather   ? Hyperlipidemia Paternal Grandfather   ? ?Allergies  ?Allergen Reactions  ? Cephalosporins   ?  Unknown reaction  ? Crab (Diagnostic) Itching  ? ?Current Outpatient Medications  ?Medication Sig Dispense Refill  ? albuterol (VENTOLIN HFA)  108 (90 Base) MCG/ACT inhaler Inhale 2 puffs into the lungs every 6 (six) hours as needed for wheezing. 1 each 3  ? aspirin EC 325 MG tablet Take 1 tablet (325 mg total) by mouth daily. 30 tablet 0  ? oxyCODONE (OXY IR/ROXICODONE) 5 MG immediate release tablet Take 1 tablet (5 mg total) by mouth every 4 (four) hours as needed (severe pain). 20 tablet 0  ? ?No current facility-administered medications for this visit.  ? ?No results found. ? ?Review of Systems:   ?A ROS was performed including pertinent positives and negatives as documented in the HPI. ? ? ?Musculoskeletal Exam:   ? ?There were no vitals taken for this visit. ? ?Right hip portals are well-healing.  No erythema or drainage.  He is able to flex and extend at the right elbow.  Fires all of the motor groups of the right hand.  Sensation is intact throughout.  2+ radial pulse. ? ?Imaging:   ? ?none ? ?I personally reviewed and interpreted the radiographs. ? ? ?Assessment:   ?2 weeks status post right shoulder labral repair.  Overall he is doing very well.  At this time I would like him to continue to work on passive range  of motion.  He will continue to work with physical therapy to progress to the protocol.  All questions were answered.  Sutures removed today.  I like him to return to clinic in 4 weeks for reassessment. ? ?Plan :   ? ?-Return to clinic 4 weeks for reassessment ? ? ? ? ? ?I personally saw and evaluated the patient, and participated in the management and treatment plan. ? ?Vanetta Mulders, MD ?Attending Physician, Orthopedic Surgery ? ?This document was dictated using Systems analyst. A reasonable attempt at proof reading has been made to minimize errors. ?

## 2021-11-27 ENCOUNTER — Ambulatory Visit (HOSPITAL_BASED_OUTPATIENT_CLINIC_OR_DEPARTMENT_OTHER): Payer: No Typology Code available for payment source | Admitting: Physical Therapy

## 2021-11-27 DIAGNOSIS — M6281 Muscle weakness (generalized): Secondary | ICD-10-CM

## 2021-11-27 DIAGNOSIS — M25611 Stiffness of right shoulder, not elsewhere classified: Secondary | ICD-10-CM

## 2021-11-27 DIAGNOSIS — M25511 Pain in right shoulder: Secondary | ICD-10-CM

## 2021-11-27 NOTE — Therapy (Signed)
?OUTPATIENT PHYSICAL THERAPY SHOULDER Treatment  ? ?Patient Name: Timothy Griffith ?MRN: [id number] ?DOB:June 30, 1996, 26 y.o., male ?Today's Date: 11/27/2021 ? ? PT End of Session - 11/27/21 1107   ? ? Visit Number 3   ? Number of Visits 25   ? Date for PT Re-Evaluation 02/09/22   ? Authorization Type [employer]   ? PT Start Time 1102   ? PT Stop Time 1130   limited per protocol  ? PT Time Calculation (min) 28 min   ? Activity Tolerance Patient tolerated treatment well   ? ?  ?  ? ?  ? ? ? ?Past Medical History:  ?Diagnosis Date  ? Asthma   ? COVID-19   ? Jan 2021 and Jan 2022  ? Environmental allergies   ? ?Past Surgical History:  ?Procedure Laterality Date  ? SHOULDER ARTHROSCOPY WITH LABRAL REPAIR Right 11/07/2021  ? Procedure: RIGHT SHOULDER ARTHROSCOPY WITH LABRAL REPAIR;  Surgeon: Huel CoteBokshan, Steven, MD;  Location: [facility] OR;  Service: Orthopedics;  Laterality: Right;  ? ?Patient Active Problem List  ? Diagnosis Date Noted  ? Right shoulder pain 09/25/2021  ? Exercise-induced asthma 06/03/2012  ? Scoliosis 06/03/2012  ? ADD (attention deficit disorder with hyperactivity) 09/16/2011  ? ? ?PCP: Ardith DarkParker, Caleb M, MD ? ?REFERRING PROVIDER:Dr Huel CoteSteven Bokshan  ? ?REFERRING DIAG:  ? ?THERAPY DIAG:  ?Acute pain of right shoulder ? ?Stiffness of right shoulder, not elsewhere classified ? ?Muscle weakness (generalized) ? ? ?ONSET DATE: initial injury in 2017; Rt labral repair on 4/4 ? ?SUBJECTIVE:                                                                                                                                                                                     ? ?SUBJECTIVE STATEMENT: ?The patient's family member reports the patient has complained of dizziness when lying down since surgery and some low back pain.  He denies shoulder pain.  Denies fever.    ?PERTINENT HISTORY: ?asthma ? ?PAIN:  ?Are you having pain? No ? ?PRECAUTIONS: Other: labral repair ? ?WEIGHT BEARING RESTRICTIONS Yes shoulder ? ?FALLS:  ?Has patient  fallen in last 6 months? No ? ?LIVING ENVIRONMENT: ?Lives with: lives with their family ? ?OCCUPATION: ?Not working right now ? ?PLOF: Independent ? ?PATIENT GOALS playing sports ? ?OBJECTIVE:  ? ? ?PATIENT SURVEYS:  ?FOTO 22 ? ?COGNITION: ? Overall cognitive status: Within functional limits for tasks assessed ?    ?SENSATION: ?WFL ? ?POSTURE: ?Forward rounded shoulders as expected post op ? ?UPPER EXTREMITY ROM:  ? ?Passive ROM Right ?11/27/2021 Left ?11/27/2021  ?Shoulder flexion  95  ?Shoulder extension    ?Shoulder abduction    ?  Shoulder adduction    ?Shoulder internal rotation    ?Shoulder external rotation  Neutral   ?Elbow flexion    ?Elbow extension    ?Wrist flexion    ?Wrist extension    ?Wrist ulnar deviation    ?Wrist radial deviation    ?Wrist pronation    ?Wrist supination    ?(Blank rows = not tested) ? ?UPPER EXTREMITY MMT: 4/10 pt was very guarded in motion, was able to passively flex to 45 per visual inspection ? ?MMT Right ?11/27/2021   ?Shoulder flexion    ?Shoulder extension    ?Shoulder abduction    ?Shoulder adduction    ?Shoulder internal rotation    ?Shoulder external rotation    ?Middle trapezius    ?Lower trapezius    ?Elbow flexion    ?Elbow extension    ?Wrist flexion    ?Wrist extension    ?Wrist ulnar deviation    ?Wrist radial deviation    ?Wrist pronation    ?Wrist supination    ?Grip strength (lbs)    ?(Blank rows = not tested) ? ? ?  ?TODAY'S TREATMENT:  ?11/27/21: ? PROM: flexion 90 Degrees; abduction 45 degrees   ER 20 degrees at 0 degrees abduction 3x10 each   ?Discussion of healing process and protocol parameters and progression ?Discussed change of position often and short walks with sling on ?He expressed interest in doing the stationary bike ? ?Review of HEP: Self flexion x5 with cuing for range and hand placement  ?AAROM ER x10 5 sec hold  ? ?Sling isometrics ?IR ER and flexion with min cuing. Good technique noted 10x 5 sec hold.  ? ? ?4/17 ?Manual: gentle PROM into ER and  flexion  ?Trigger point release to upper trap  ? ?AAROM:  ? ?Self flexion x5 with cuing for range and hand placement  ?AAROM ER x10 5 sec hold  ? ?Sling isos  ?IR ER and flexion with min cuing. Good technique noted 10x 5 sec hold.  ? ?4/10: ?PT replaced bandages- no s/s of infection ?Passive flexion ?Scapular retraction ?Biceps AAROM ?Wrist motion/grip with putty ? ? ?PATIENT EDUCATION: ?Education details: Anatomy of condition, POC, HEP, exercise form/rationale ? ?Person educated: Patient ?Education method: Explanation, Demonstration, Tactile cues, Verbal cues, and Handouts ?Education comprehension: verbalized understanding, returned demonstration, verbal cues required, tactile cues required, and needs further education ? ? ?HOME EXERCISE PROGRAM: ?[id number] ? ?ASSESSMENT: ? ?CLINICAL IMPRESSION: ?The patient is on target with surgical protocol.  External rotation is the most limited direction but improved to 20 degrees with repetition.  Therapist monitoring response to all interventions with patient denying pain throughout session.   ? ? ?OBJECTIVE IMPAIRMENTS decreased activity tolerance, decreased ROM, decreased strength, increased muscle spasms, impaired flexibility, impaired UE functional use, improper body mechanics, postural dysfunction, and pain.  ? ?ACTIVITY LIMITATIONS cleaning, community activity, driving, meal prep, and yard work.  ? ?PERSONAL FACTORS 1-2 comorbidities: asthma, bilateral shoulder pain  are also affecting patient's functional outcome.  ? ? ?REHAB POTENTIAL: Good ? ?CLINICAL DECISION MAKING: Stable/uncomplicated ? ?EVALUATION COMPLEXITY: Low ? ? ?GOALS: ?Goals reviewed with patient? Yes ? ?SHORT TERM GOALS: Target date: 12/12/2021 ? ?Passive flexion to 120 ?Baseline: ?Goal status: INITIAL ? ?2.  Proper activation of scapular retraction ?Baseline:  ?Goal status: INITIAL ? ? ?LONG TERM GOALS: Target date: 02/06/2022 ? ?Able to maneuver at least 5lb through full ROM  ?Baseline:  ?Goal status:  INITIAL ? ?2.  GHJ strength to 75% of opp UE via  tindeq/dynamometry testing ?Baseline:  ?Goal status: INITIAL ? ?3.  Pt will meet FOTO goal ?Baseline:  ?Goal status: INITIAL ? ?4.  Further goals to be set regarding return to sport at this time ? ? ?PLAN: ?PT FREQUENCY: 1-2x/week ? ?PT DURATION: 12 weeks ? ?PLANNED INTERVENTIONS: Therapeutic exercises, Therapeutic activity, Neuromuscular re-education, Patient/Family education, Joint mobilization, Aquatic Therapy, Dry Needling, Electrical stimulation, Spinal mobilization, Cryotherapy, Moist heat, Taping, Traction, and Manual therapy ? ?PLAN FOR NEXT SESSION: continue PROM per protocol, scap mobility;  isometrics and AAROM. Continue with PROM in all planes.  ? ?Lavinia Sharps, PT ?11/27/21 11:41 AM ?Phone: [phone] ?Fax: [phone]  ?

## 2021-12-04 ENCOUNTER — Ambulatory Visit (HOSPITAL_BASED_OUTPATIENT_CLINIC_OR_DEPARTMENT_OTHER): Payer: No Typology Code available for payment source | Attending: Orthopaedic Surgery | Admitting: Physical Therapy

## 2021-12-04 ENCOUNTER — Encounter (HOSPITAL_BASED_OUTPATIENT_CLINIC_OR_DEPARTMENT_OTHER): Payer: Self-pay | Admitting: Physical Therapy

## 2021-12-04 DIAGNOSIS — M25611 Stiffness of right shoulder, not elsewhere classified: Secondary | ICD-10-CM | POA: Diagnosis present

## 2021-12-04 DIAGNOSIS — M25511 Pain in right shoulder: Secondary | ICD-10-CM | POA: Insufficient documentation

## 2021-12-04 DIAGNOSIS — M6281 Muscle weakness (generalized): Secondary | ICD-10-CM | POA: Insufficient documentation

## 2021-12-04 NOTE — Therapy (Signed)
?OUTPATIENT PHYSICAL THERAPY SHOULDER Treatment  ? ?Patient Name: Timothy Griffith ?MRN: [id number] ?DOB:11-11-1995, 26 y.o., male ?Today's Date: 12/04/2021 ? ? ? ? ? ?Past Medical History:  ?Diagnosis Date  ? Asthma   ? COVID-19   ? Jan 2021 and Jan 2022  ? Environmental allergies   ? ?Past Surgical History:  ?Procedure Laterality Date  ? SHOULDER ARTHROSCOPY WITH LABRAL REPAIR Right 11/07/2021  ? Procedure: RIGHT SHOULDER ARTHROSCOPY WITH LABRAL REPAIR;  Surgeon: [name], MD;  Location: [facility] OR;  Service: Orthopedics;  Laterality: Right;  ? ?Patient Active Problem List  ? Diagnosis Date Noted  ? Right shoulder pain 09/25/2021  ? Exercise-induced asthma 06/03/2012  ? Scoliosis 06/03/2012  ? ADD (attention deficit disorder with hyperactivity) 09/16/2011  ? ? ?PCP: Ardith Dark, MD ? ?REFERRING PROVIDER:Dr [name]  ? ?REFERRING DIAG:  ? ?THERAPY DIAG:  ?No diagnosis found. ? ? ?ONSET DATE: initial injury in 2017; Rt labral repair on 4/4 ? ?SUBJECTIVE:                                                                                                                                                                                     ? ?SUBJECTIVE STATEMENT: ?The patient's family member reports the patient has complained of dizziness when lying down since surgery and some low back pain.  He denies shoulder pain.  Denies fever.    ?PERTINENT HISTORY: ?asthma ? ?PAIN:  ?Are you having pain? No ? ?PRECAUTIONS: Other: labral repair ? ?WEIGHT BEARING RESTRICTIONS Yes shoulder ? ?FALLS:  ?Has patient fallen in last 6 months? No ? ?LIVING ENVIRONMENT: ?Lives with: lives with their family ? ?OCCUPATION: ?Not working right now ? ?PLOF: Independent ? ?PATIENT GOALS playing sports ? ?OBJECTIVE:  ? ? ?PATIENT SURVEYS:  ?FOTO 22 ? ?COGNITION: ? Overall cognitive status: Within functional limits for tasks assessed ?    ?SENSATION: ?WFL ? ?POSTURE: ?Forward rounded shoulders as expected post op ? ?UPPER EXTREMITY ROM:   ? ?Passive ROM Right ?12/04/2021 Left ?12/04/2021  ?Shoulder flexion  95  ?Shoulder extension    ?Shoulder abduction    ?Shoulder adduction    ?Shoulder internal rotation    ?Shoulder external rotation  Neutral   ?Elbow flexion    ?Elbow extension    ?Wrist flexion    ?Wrist extension    ?Wrist ulnar deviation    ?Wrist radial deviation    ?Wrist pronation    ?Wrist supination    ?(Blank rows = not tested) ? ?UPPER EXTREMITY MMT: 4/10 pt was very guarded in motion, was able to passively flex to 45 per visual inspection ? ?MMT Right ?12/04/2021   ?Shoulder flexion    ?  Shoulder extension    ?Shoulder abduction    ?Shoulder adduction    ?Shoulder internal rotation    ?Shoulder external rotation    ?Middle trapezius    ?Lower trapezius    ?Elbow flexion    ?Elbow extension    ?Wrist flexion    ?Wrist extension    ?Wrist ulnar deviation    ?Wrist radial deviation    ?Wrist pronation    ?Wrist supination    ?Grip strength (lbs)    ?(Blank rows = not tested) ? ? ?  ?TODAY'S TREATMENT:  ?PROM: gentle PROM into all planes. Flexion to 90 degrees ER 15 degrees abduction 50 degrees  ? ?AAROM flexion 2x10; ? ?ER with wand 2x10 5 sec hold with cuing for self stretch  ? ? ? ?Scap retraction 2x115 red  ?Shoulder extension 2x15 red reviewed how to do at home ? ? ? ?11/27/21: ? PROM: flexion 90 Degrees; abduction 45 degrees   ER 20 degrees at 0 degrees abduction 3x10 each   ?Discussion of healing process and protocol parameters and progression ?Discussed change of position often and short walks with sling on ?He expressed interest in doing the stationary bike ? ?Review of HEP: Self flexion x5 with cuing for range and hand placement  ?AAROM ER x10 5 sec hold  ? ?Sling isometrics ?IR ER and flexion with min cuing. Good technique noted 10x 5 sec hold.  ? ? ?4/17 ?Manual: gentle PROM into ER and flexion  ?Trigger point release to upper trap  ? ?AAROM:  ? ?Self flexion x5 with cuing for range and hand placement  ?AAROM ER x10 5 sec hold   ? ?Sling isos  ?IR ER and flexion with min cuing. Good technique noted 10x 5 sec hold.  ? ?4/10: ?PT replaced bandages- no s/s of infection ?Passive flexion ?Scapular retraction ?Biceps AAROM ?Wrist motion/grip with putty ? ? ?PATIENT EDUCATION: ?Education details: Anatomy of condition, POC, HEP, exercise form/rationale ? ?Person educated: Patient ?Education method: Explanation, Demonstration, Tactile cues, Verbal cues, and Handouts ?Education comprehension: verbalized understanding, returned demonstration, verbal cues required, tactile cues required, and needs further education ? ? ?HOME EXERCISE PROGRAM: ?[id number] ? ?ASSESSMENT: ? ?CLINICAL IMPRESSION: ?The patients IR was slightly more limited today although he was guarded with his motion. He was given a self ER stretch to doa t home. He tolerated well. We reviewed his HEP with him. He was given scapular strengthening exercises to work on at home. He had no increase in pain. We will continue to progress as tolerated.  ? ?OBJECTIVE IMPAIRMENTS decreased activity tolerance, decreased ROM, decreased strength, increased muscle spasms, impaired flexibility, impaired UE functional use, improper body mechanics, postural dysfunction, and pain.  ? ?ACTIVITY LIMITATIONS cleaning, community activity, driving, meal prep, and yard work.  ? ?PERSONAL FACTORS 1-2 comorbidities: asthma, bilateral shoulder pain  are also affecting patient's functional outcome.  ? ? ?REHAB POTENTIAL: Good ? ?CLINICAL DECISION MAKING: Stable/uncomplicated ? ?EVALUATION COMPLEXITY: Low ? ? ?GOALS: ?Goals reviewed with patient? Yes ? ?SHORT TERM GOALS: Target date: 12/12/2021 ? ?Passive flexion to 120 ?Baseline: ?Goal status: INITIAL ? ?2.  Proper activation of scapular retraction ?Baseline:  ?Goal status: INITIAL ? ? ?LONG TERM GOALS: Target date: 02/06/2022 ? ?Able to maneuver at least 5lb through full ROM  ?Baseline:  ?Goal status: INITIAL ? ?2.  GHJ strength to 75% of opp UE via tindeq/dynamometry  testing ?Baseline:  ?Goal status: INITIAL ? ?3.  Pt will meet FOTO goal ?Baseline:  ?Goal status: INITIAL ? ?  4.  Further goals to be set regarding return to sport at this time ? ? ?PLAN: ?PT FREQUENCY: 1-2x/week ? ?PT DURATION: 12 weeks ? ?PLANNED INTERVENTIONS: Therapeutic exercises, Therapeutic activity, Neuromuscular re-education, Patient/Family education, Joint mobilization, Aquatic Therapy, Dry Needling, Electrical stimulation, Spinal mobilization, Cryotherapy, Moist heat, Taping, Traction, and Manual therapy ? ?PLAN FOR NEXT SESSION: continue PROM per protocol, scap mobility;  isometrics and AAROM. Continue with PROM in all planes.  ? ? ?Lorayne Bender PT DPT ?12/04/21 1:24 PM ?Phone: [phone] ?Fax: [phone]  ?

## 2021-12-06 ENCOUNTER — Ambulatory Visit (HOSPITAL_BASED_OUTPATIENT_CLINIC_OR_DEPARTMENT_OTHER): Payer: No Typology Code available for payment source | Admitting: Physical Therapy

## 2021-12-06 ENCOUNTER — Encounter (HOSPITAL_BASED_OUTPATIENT_CLINIC_OR_DEPARTMENT_OTHER): Payer: Self-pay | Admitting: Physical Therapy

## 2021-12-06 DIAGNOSIS — M25611 Stiffness of right shoulder, not elsewhere classified: Secondary | ICD-10-CM

## 2021-12-06 DIAGNOSIS — M25511 Pain in right shoulder: Secondary | ICD-10-CM | POA: Diagnosis not present

## 2021-12-06 DIAGNOSIS — M6281 Muscle weakness (generalized): Secondary | ICD-10-CM

## 2021-12-06 NOTE — Therapy (Signed)
?OUTPATIENT PHYSICAL THERAPY SHOULDER Treatment  ? ?Patient Name: Timothy Griffith ?MRN: [id number] ?DOB:Nov 22, 1995, 26 y.o., male ?Today's Date: 12/06/2021 ? ? ? ? ? ?Past Medical History:  ?Diagnosis Date  ? Asthma   ? COVID-19   ? Jan 2021 and Jan 2022  ? Environmental allergies   ? ?Past Surgical History:  ?Procedure Laterality Date  ? SHOULDER ARTHROSCOPY WITH LABRAL REPAIR Right 11/07/2021  ? Procedure: RIGHT SHOULDER ARTHROSCOPY WITH LABRAL REPAIR;  Surgeon: [name], MD;  Location: [facility] OR;  Service: Orthopedics;  Laterality: Right;  ? ?Patient Active Problem List  ? Diagnosis Date Noted  ? Right shoulder pain 09/25/2021  ? Exercise-induced asthma 06/03/2012  ? Scoliosis 06/03/2012  ? ADD (attention deficit disorder with hyperactivity) 09/16/2011  ? ? ?PCP: Ardith Dark, MD ? ?REFERRING PROVIDER:Dr [name]  ? ?REFERRING DIAG:  ? ?THERAPY DIAG:  ?No diagnosis found. ? ? ?ONSET DATE: initial injury in 2017; Rt labral repair on 4/4 ? ?SUBJECTIVE:                                                                                                                                                                                     ? ?SUBJECTIVE STATEMENT: ?The patient has no complaints today. He had no soreness after the last visit.  ? ?PERTINENT HISTORY: ?asthma ? ?PAIN:  ?Are you having pain? No ? ?PRECAUTIONS: Other: labral repair ? ?WEIGHT BEARING RESTRICTIONS Yes shoulder ? ?FALLS:  ?Has patient fallen in last 6 months? No ? ?LIVING ENVIRONMENT: ?Lives with: lives with their family ? ?OCCUPATION: ?Not working right now ? ?PLOF: Independent ? ?PATIENT GOALS playing sports ? ?OBJECTIVE:  ? ? ?PATIENT SURVEYS:  ?FOTO 22 ? ?COGNITION: ? Overall cognitive status: Within functional limits for tasks assessed ?    ?SENSATION: ?WFL ? ?POSTURE: ?Forward rounded shoulders as expected post op ? ?UPPER EXTREMITY ROM:  ? ?Passive ROM Right ?12/06/2021 Left ?12/06/2021  ?Shoulder flexion  120  ?Shoulder extension     ?Shoulder abduction    ?Shoulder adduction    ?Shoulder internal rotation    ?Shoulder external rotation  26 ?  ?Elbow flexion    ?Elbow extension    ?Wrist flexion    ?Wrist extension    ?Wrist ulnar deviation    ?Wrist radial deviation    ?Wrist pronation    ?Wrist supination    ?(Blank rows = not tested) ? ?UPPER EXTREMITY MMT: 4/10 pt was very guarded in motion, was able to passively flex to 45 per visual inspection ? ?MMT Right ?12/06/2021   ?Shoulder flexion    ?Shoulder extension    ?Shoulder abduction    ?Shoulder adduction    ?  Shoulder internal rotation    ?Shoulder external rotation    ?Middle trapezius    ?Lower trapezius    ?Elbow flexion    ?Elbow extension    ?Wrist flexion    ?Wrist extension    ?Wrist ulnar deviation    ?Wrist radial deviation    ?Wrist pronation    ?Wrist supination    ?Grip strength (lbs)    ?(Blank rows = not tested) ? ? ?  ?TODAY'S TREATMENT:  ?5/3 ?PROM: gentle PROM into all planes. Flexion to 90 degrees ER 15 degrees abduction 50 degrees  ? ?AAROM flexion 2x10 with wand ; ? ?ER with wand 2x10 5 sec hold with cuing for self stretch  ? ?Scap retraction 2x115 red  ?Shoulder extension 2x15 red ? ?Rythmic stabilization 2x10  ? ? ? ?5/1 ?PROM: gentle PROM into all planes. Flexion to 90 degrees ER 15 degrees abduction 50 degrees  ? ?AAROM flexion 2x10; ? ?ER with wand 2x10 5 sec hold with cuing for self stretch  ? ? ? ?Scap retraction 2x115 red  ?Shoulder extension 2x15 red reviewed how to do at home ? ? ? ?11/27/21: ? PROM: flexion 90 Degrees; abduction 45 degrees   ER 20 degrees at 0 degrees abduction 3x10 each   ?Discussion of healing process and protocol parameters and progression ?Discussed change of position often and short walks with sling on ?He expressed interest in doing the stationary bike ? ?Review of HEP: Self flexion x5 with cuing for range and hand placement  ?AAROM ER x10 5 sec hold  ? ?Sling isometrics ?IR ER and flexion with min cuing. Good technique noted 10x 5 sec  hold.  ? ? ?4/17 ?Manual: gentle PROM into ER and flexion  ?Trigger point release to upper trap  ? ?AAROM:  ? ?Self flexion x5 with cuing for range and hand placement  ?AAROM ER x10 5 sec hold  ? ?Sling isos  ?IR ER and flexion with min cuing. Good technique noted 10x 5 sec hold.  ? ?4/10: ?PT replaced bandages- no s/s of infection ?Passive flexion ?Scapular retraction ?Biceps AAROM ?Wrist motion/grip with putty ? ? ?PATIENT EDUCATION: ?Education details: Anatomy of condition, POC, HEP, exercise form/rationale ? ?Person educated: Patient ?Education method: Explanation, Demonstration, Tactile cues, Verbal cues, and Handouts ?Education comprehension: verbalized understanding, returned demonstration, verbal cues required, tactile cues required, and needs further education ? ? ?HOME EXERCISE PROGRAM: ?[id number] ? ?ASSESSMENT: ? ?CLINICAL IMPRESSION: ?The patient motion opened up well today. His flexion improved to 120 and his ER improved by 6 degrees. He had minor pain with flexion with wand. He tolerated his scpaular exercises well. Therapy also added in rhythmic stabilization.  ? ?OBJECTIVE IMPAIRMENTS decreased activity tolerance, decreased ROM, decreased strength, increased muscle spasms, impaired flexibility, impaired UE functional use, improper body mechanics, postural dysfunction, and pain.  ? ?ACTIVITY LIMITATIONS cleaning, community activity, driving, meal prep, and yard work.  ? ?PERSONAL FACTORS 1-2 comorbidities: asthma, bilateral shoulder pain  are also affecting patient's functional outcome.  ? ? ?REHAB POTENTIAL: Good ? ?CLINICAL DECISION MAKING: Stable/uncomplicated ? ?EVALUATION COMPLEXITY: Low ? ? ?GOALS: ?Goals reviewed with patient? Yes ? ?SHORT TERM GOALS: Target date: 12/12/2021 ? ?Passive flexion to 120 ?Baseline: ?Goal status: INITIAL ? ?2.  Proper activation of scapular retraction ?Baseline:  ?Goal status: INITIAL ? ? ?LONG TERM GOALS: Target date: 02/06/2022 ? ?Able to maneuver at least 5lb through  full ROM  ?Baseline:  ?Goal status: INITIAL ? ?2.  GHJ strength to 75% of  opp UE via tindeq/dynamometry testing ?Baseline:  ?Goal status: INITIAL ? ?3.  Pt will meet FOTO goal ?Baseline:  ?Goal status: INITIAL ? ?4.  Further goals to be set regarding return to sport at this time ? ? ?PLAN: ?PT FREQUENCY: 1-2x/week ? ?PT DURATION: 12 weeks ? ?PLANNED INTERVENTIONS: Therapeutic exercises, Therapeutic activity, Neuromuscular re-education, Patient/Family education, Joint mobilization, Aquatic Therapy, Dry Needling, Electrical stimulation, Spinal mobilization, Cryotherapy, Moist heat, Taping, Traction, and Manual therapy ? ?PLAN FOR NEXT SESSION: continue PROM per protocol, scap mobility;  isometrics and AAROM. Continue with PROM in all planes.  ? ? ?Lorayne Bender PT DPT ?12/06/21 2:35 PM ?Phone: [phone] ?Fax: [phone]  ?

## 2021-12-11 ENCOUNTER — Encounter (HOSPITAL_BASED_OUTPATIENT_CLINIC_OR_DEPARTMENT_OTHER): Payer: Self-pay | Admitting: Physical Therapy

## 2021-12-11 ENCOUNTER — Ambulatory Visit (HOSPITAL_BASED_OUTPATIENT_CLINIC_OR_DEPARTMENT_OTHER): Payer: No Typology Code available for payment source | Admitting: Physical Therapy

## 2021-12-11 DIAGNOSIS — M25611 Stiffness of right shoulder, not elsewhere classified: Secondary | ICD-10-CM

## 2021-12-11 DIAGNOSIS — M25511 Pain in right shoulder: Secondary | ICD-10-CM

## 2021-12-11 DIAGNOSIS — M6281 Muscle weakness (generalized): Secondary | ICD-10-CM

## 2021-12-11 NOTE — Therapy (Signed)
?OUTPATIENT PHYSICAL THERAPY SHOULDER Treatment  ? ?Patient Name: Timothy Griffith ?MRN: [id number] ?DOB:13-Apr-1996, 26 y.o., male ?Today's Date: 12/11/2021 ? ? ? ? ? ?Past Medical History:  ?Diagnosis Date  ? Asthma   ? COVID-19   ? Jan 2021 and Jan 2022  ? Environmental allergies   ? ?Past Surgical History:  ?Procedure Laterality Date  ? SHOULDER ARTHROSCOPY WITH LABRAL REPAIR Right 11/07/2021  ? Procedure: RIGHT SHOULDER ARTHROSCOPY WITH LABRAL REPAIR;  Surgeon: [name], MD;  Location: [facility] OR;  Service: Orthopedics;  Laterality: Right;  ? ?Patient Active Problem List  ? Diagnosis Date Noted  ? Right shoulder pain 09/25/2021  ? Exercise-induced asthma 06/03/2012  ? Scoliosis 06/03/2012  ? ADD (attention deficit disorder with hyperactivity) 09/16/2011  ? ? ?PCP: Ardith Dark, MD ? ?REFERRING PROVIDER:Dr [name]  ? ?REFERRING DIAG:  ? ?THERAPY DIAG:  ?No diagnosis found. ? ? ?ONSET DATE: initial injury in 2017; Rt labral repair on 4/4 ? ?SUBJECTIVE:                                                                                                                                                                                     ? ?SUBJECTIVE STATEMENT: ?The patient has no complaints today. He had no soreness after the last visit. 5/8 ? ?PERTINENT HISTORY: ?asthma ? ?PAIN:  ?Are you having pain? No ? ?PRECAUTIONS: Other: labral repair ? ?WEIGHT BEARING RESTRICTIONS Yes shoulder ? ?FALLS:  ?Has patient fallen in last 6 months? No ? ?LIVING ENVIRONMENT: ?Lives with: lives with their family ? ?OCCUPATION: ?Not working right now ? ?PLOF: Independent ? ?PATIENT GOALS playing sports ? ?OBJECTIVE:  ? ? ?PATIENT SURVEYS:  ?FOTO 22 ? ?COGNITION: ? Overall cognitive status: Within functional limits for tasks assessed ?    ?SENSATION: ?WFL ? ?POSTURE: ?Forward rounded shoulders as expected post op ? ?UPPER EXTREMITY ROM:  ? ?Passive ROM Right ?12/11/2021 Left ?12/11/2021  ?Shoulder flexion  120  ?Shoulder extension     ?Shoulder abduction    ?Shoulder adduction    ?Shoulder internal rotation    ?Shoulder external rotation  26 ?  ?Elbow flexion    ?Elbow extension    ?Wrist flexion    ?Wrist extension    ?Wrist ulnar deviation    ?Wrist radial deviation    ?Wrist pronation    ?Wrist supination    ?(Blank rows = not tested) ? ?UPPER EXTREMITY MMT: 4/10 pt was very guarded in motion, was able to passively flex to 45 per visual inspection ? ?MMT Right ?12/11/2021   ?Shoulder flexion    ?Shoulder extension    ?Shoulder abduction    ?Shoulder adduction    ?  Shoulder internal rotation    ?Shoulder external rotation    ?Middle trapezius    ?Lower trapezius    ?Elbow flexion    ?Elbow extension    ?Wrist flexion    ?Wrist extension    ?Wrist ulnar deviation    ?Wrist radial deviation    ?Wrist pronation    ?Wrist supination    ?Grip strength (lbs)    ?(Blank rows = not tested) ? ? ?  ?TODAY'S TREATMENT:  ?5/4 ?PROM: gentle PROM into all planes. Flexion to 90 degrees ER 15 degrees abduction 50 degrees  ? ?AAROM flexion 2x10 with wand ; ? ?ER with wand 2x10 5 sec hold with cuing for self stretch  ? ?Side lying ER 3x10  ? ?Scap retraction 2x115 red  ?Shoulder extension 2x15 red ? ?Rythmic stabilization 2x10  ? ?Pulleys 2 min  ?Finger ladder 3x  ? ?5/3 ?PROM: gentle PROM into all planes. Flexion to 90 degrees ER 15 degrees abduction 50 degrees  ? ?AAROM flexion 2x10 with wand ; ? ?ER with wand 2x10 5 sec hold with cuing for self stretch  ? ?Scap retraction 2x115 red  ?Shoulder extension 2x15 red ? ?Rythmic stabilization 2x10  ? ? ? ?5/1 ?PROM: gentle PROM into all planes. Flexion to 90 degrees ER 15 degrees abduction 50 degrees  ? ?AAROM flexion 2x10; ? ?ER with wand 2x10 5 sec hold with cuing for self stretch  ? ? ? ?Scap retraction 2x115 red  ?Shoulder extension 2x15 red reviewed how to do at home ? ? ? ?PATIENT EDUCATION: ?Education details: Anatomy of condition, POC, HEP, exercise form/rationale ? ?Person educated: Patient ?Education method:  Explanation, Demonstration, Tactile cues, Verbal cues, and Handouts ?Education comprehension: verbalized understanding, returned demonstration, verbal cues required, tactile cues required, and needs further education ? ? ?HOME EXERCISE PROGRAM: ?[id number] ? ?ASSESSMENT: ? ?CLINICAL IMPRESSION: ?The patient is making good progress. His flexion is improving. His ER is still somewhat guarded. Therapy added side lying er without weight today and the finger ladder. We will continue to progress as tolerated.  ?OBJECTIVE IMPAIRMENTS decreased activity tolerance, decreased ROM, decreased strength, increased muscle spasms, impaired flexibility, impaired UE functional use, improper body mechanics, postural dysfunction, and pain.  ? ?ACTIVITY LIMITATIONS cleaning, community activity, driving, meal prep, and yard work.  ? ?PERSONAL FACTORS 1-2 comorbidities: asthma, bilateral shoulder pain  are also affecting patient's functional outcome.  ? ? ?REHAB POTENTIAL: Good ? ?CLINICAL DECISION MAKING: Stable/uncomplicated ? ?EVALUATION COMPLEXITY: Low ? ? ?GOALS: ?Goals reviewed with patient? Yes ? ?SHORT TERM GOALS: Target date: 12/12/2021 ? ?Passive flexion to 120 ?Baseline: ?Goal status: INITIAL ? ?2.  Proper activation of scapular retraction ?Baseline:  ?Goal status: INITIAL ? ? ?LONG TERM GOALS: Target date: 02/06/2022 ? ?Able to maneuver at least 5lb through full ROM  ?Baseline:  ?Goal status: INITIAL ? ?2.  GHJ strength to 75% of opp UE via tindeq/dynamometry testing ?Baseline:  ?Goal status: INITIAL ? ?3.  Pt will meet FOTO goal ?Baseline:  ?Goal status: INITIAL ? ?4.  Further goals to be set regarding return to sport at this time ? ? ?PLAN: ?PT FREQUENCY: 1-2x/week ? ?PT DURATION: 12 weeks ? ?PLANNED INTERVENTIONS: Therapeutic exercises, Therapeutic activity, Neuromuscular re-education, Patient/Family education, Joint mobilization, Aquatic Therapy, Dry Needling, Electrical stimulation, Spinal mobilization, Cryotherapy, Moist heat,  Taping, Traction, and Manual therapy ? ?PLAN FOR NEXT SESSION: continue PROM per protocol, scap mobility;  isometrics and AAROM. Continue with PROM in all planes.  ? ? ?Lorayne Benderavid Breea Loncar PT  DPT ?12/11/21 2:36 PM ?Phone: [phone] ?Fax: [phone]  ?

## 2021-12-13 ENCOUNTER — Ambulatory Visit (HOSPITAL_BASED_OUTPATIENT_CLINIC_OR_DEPARTMENT_OTHER): Payer: No Typology Code available for payment source | Admitting: Physical Therapy

## 2021-12-13 DIAGNOSIS — M25611 Stiffness of right shoulder, not elsewhere classified: Secondary | ICD-10-CM

## 2021-12-13 DIAGNOSIS — M25511 Pain in right shoulder: Secondary | ICD-10-CM | POA: Diagnosis not present

## 2021-12-13 DIAGNOSIS — M6281 Muscle weakness (generalized): Secondary | ICD-10-CM

## 2021-12-13 NOTE — Therapy (Signed)
?OUTPATIENT PHYSICAL THERAPY SHOULDER Treatment  ? ?Patient Name: Timothy Griffith ?MRN: [id number] ?DOB:1996/06/24, 26 y.o., male ?Today's Date: 12/13/2021 ? ? ? ? ? ?Past Medical History:  ?Diagnosis Date  ? Asthma   ? COVID-19   ? Jan 2021 and Jan 2022  ? Environmental allergies   ? ?Past Surgical History:  ?Procedure Laterality Date  ? SHOULDER ARTHROSCOPY WITH LABRAL REPAIR Right 11/07/2021  ? Procedure: RIGHT SHOULDER ARTHROSCOPY WITH LABRAL REPAIR;  Surgeon: [name], MD;  Location: [facility];  Service: Orthopedics;  Laterality: Right;  ? ?Patient Active Problem List  ? Diagnosis Date Noted  ? Right shoulder pain 09/25/2021  ? Exercise-induced asthma 06/03/2012  ? Scoliosis 06/03/2012  ? ADD (attention deficit disorder with hyperactivity) 09/16/2011  ? ? ?PCP: Ardith Dark, MD ? ?REFERRING PROVIDER:Dr [name]  ? ?REFERRING DIAG:  ? ?THERAPY DIAG:  ?No diagnosis found. ? ? ?ONSET DATE: initial injury in 2017; Rt labral repair on 4/4 ? ?SUBJECTIVE:                                                                                                                                                                                     ? ?SUBJECTIVE STATEMENT: ?The patient has no complaints today. He had no soreness after the last visit. 5/8 ? ?PERTINENT HISTORY: ?asthma ? ?PAIN:  ?Are you having pain? No ? ?PRECAUTIONS: Other: labral repair ? ?WEIGHT BEARING RESTRICTIONS Yes shoulder ? ?FALLS:  ?Has patient fallen in last 6 months? No ? ?LIVING ENVIRONMENT: ?Lives with: lives with their family ? ?OCCUPATION: ?Not working right now ? ?PLOF: Independent ? ?PATIENT GOALS playing sports ? ?OBJECTIVE:  ? ? ?PATIENT SURVEYS:  ?FOTO 22 ? ?COGNITION: ? Overall cognitive status: Within functional limits for tasks assessed ?    ?SENSATION: ?WFL ? ?POSTURE: ?Forward rounded shoulders as expected post op ? ?UPPER EXTREMITY ROM:  ? ?Passive ROM Right ?12/13/2021 Left ?12/13/2021  ?Shoulder flexion  120  ?Shoulder extension     ?Shoulder abduction    ?Shoulder adduction    ?Shoulder internal rotation    ?Shoulder external rotation  26 ?  ?Elbow flexion    ?Elbow extension    ?Wrist flexion    ?Wrist extension    ?Wrist ulnar deviation    ?Wrist radial deviation    ?Wrist pronation    ?Wrist supination    ?(Blank rows = not tested) ? ?UPPER EXTREMITY MMT: 4/10 pt was very guarded in motion, was able to passively flex to 45 per visual inspection ? ?MMT Right ?12/13/2021   ?Shoulder flexion    ?Shoulder extension    ?Shoulder abduction    ?Shoulder adduction    ?  Shoulder internal rotation    ?Shoulder external rotation    ?Middle trapezius    ?Lower trapezius    ?Elbow flexion    ?Elbow extension    ?Wrist flexion    ?Wrist extension    ?Wrist ulnar deviation    ?Wrist radial deviation    ?Wrist pronation    ?Wrist supination    ?Grip strength (lbs)    ?(Blank rows = not tested) ? ? ?  ?TODAY'S TREATMENT:  ?5/10 ?PROM: gentle PROM into all planes. Flexion to 90 degrees ER 15 degrees abduction 50 degrees  ? ?AAROM flexion 2x10 with wand ; ? ?ER with wand 2x10 5 sec hold with cuing for self stretch  ? ?Side lying ER 3x10  ? ?Scap retraction 2x115 green  ?Shoulder extension 2x15 green  ? ?Rythmic stabilization 2x10  ? ?Pulleys 2 min  ?Finger ladder 3x to 18  ?5/4 ?PROM: gentle PROM into all planes. Flexion to 90 degrees ER 15 degrees abduction 50 degrees  ? ?AAROM flexion 2x10 with wand ; ? ?ER with wand 2x10 5 sec hold with cuing for self stretch  ? ?Side lying ER 3x10  ? ?Scap retraction 2x115 red  ?Shoulder extension 2x15 red ? ?Rythmic stabilization 2x10  ? ?Pulleys 2 min  ?Finger ladder 3x  ? ?5/3 ?PROM: gentle PROM into all planes. Flexion to 90 degrees ER 15 degrees abduction 50 degrees  ? ?AAROM flexion 2x10 with wand ; ? ?ER with wand 2x10 5 sec hold with cuing for self stretch  ? ?Scap retraction 2x115 red  ?Shoulder extension 2x15 red ? ?Rythmic stabilization 2x10  ? ? ? ?5/1 ?PROM: gentle PROM into all planes. Flexion to 90 degrees  ER 15 degrees abduction 50 degrees  ? ?AAROM flexion 2x10; ? ?ER with wand 2x10 5 sec hold with cuing for self stretch  ? ? ? ?Scap retraction 2x115 red  ?Shoulder extension 2x15 red reviewed how to do at home ? ? ? ?PATIENT EDUCATION: ?Education details: Anatomy of condition, POC, HEP, exercise form/rationale ? ?Person educated: Patient ?Education method: Explanation, Demonstration, Tactile cues, Verbal cues, and Handouts ?Education comprehension: verbalized understanding, returned demonstration, verbal cues required, tactile cues required, and needs further education ? ? ?HOME EXERCISE PROGRAM: ?[id number] ? ?ASSESSMENT: ? ?CLINICAL IMPRESSION: ?The patient continues to make progress. We started active supine flexion. He is tolerating side lying ER very well. We spoke to the MD who allowed him to start weaning out of his sling. He was advised to continue working on his ER stretching at home. We will continue to work on it in therapy.  ? ? ?OBJECTIVE IMPAIRMENTS decreased activity tolerance, decreased ROM, decreased strength, increased muscle spasms, impaired flexibility, impaired UE functional use, improper body mechanics, postural dysfunction, and pain.  ? ?ACTIVITY LIMITATIONS cleaning, community activity, driving, meal prep, and yard work.  ? ?PERSONAL FACTORS 1-2 comorbidities: asthma, bilateral shoulder pain  are also affecting patient's functional outcome.  ? ? ?REHAB POTENTIAL: Good ? ?CLINICAL DECISION MAKING: Stable/uncomplicated ? ?EVALUATION COMPLEXITY: Low ? ? ?GOALS: ?Goals reviewed with patient? Yes ? ?SHORT TERM GOALS: Target date: 12/12/2021 ? ?Passive flexion to 120 ?Baseline: ?Goal status: INITIAL ? ?2.  Proper activation of scapular retraction ?Baseline:  ?Goal status: INITIAL ? ? ?LONG TERM GOALS: Target date: 02/06/2022 ? ?Able to maneuver at least 5lb through full ROM  ?Baseline:  ?Goal status: INITIAL ? ?2.  GHJ strength to 75% of opp UE via tindeq/dynamometry testing ?Baseline:  ?Goal status:  INITIAL ? ?  3.  Pt will meet FOTO goal ?Baseline:  ?Goal status: INITIAL ? ?4.  Further goals to be set regarding return to sport at this time ? ? ?PLAN: ?PT FREQUENCY: 1-2x/week ? ?PT DURATION: 12 weeks ? ?PLANNED INTERVENTIONS: Therapeutic exercises, Therapeutic activity, Neuromuscular re-education, Patient/Family education, Joint mobilization, Aquatic Therapy, Dry Needling, Electrical stimulation, Spinal mobilization, Cryotherapy, Moist heat, Taping, Traction, and Manual therapy ? ?PLAN FOR NEXT SESSION: continue PROM per protocol, scap mobility;  isometrics and AAROM. Continue with PROM in all planes.  ? ? ?Lorayne Benderavid Ileene Allie PT DPT ?12/13/21 3:10 PM ?Phone: [phone] ?Fax: [phone]  ?

## 2021-12-14 ENCOUNTER — Encounter (HOSPITAL_BASED_OUTPATIENT_CLINIC_OR_DEPARTMENT_OTHER): Payer: Self-pay | Admitting: Physical Therapy

## 2021-12-18 ENCOUNTER — Ambulatory Visit (HOSPITAL_BASED_OUTPATIENT_CLINIC_OR_DEPARTMENT_OTHER): Payer: No Typology Code available for payment source | Admitting: Physical Therapy

## 2021-12-18 ENCOUNTER — Encounter (HOSPITAL_BASED_OUTPATIENT_CLINIC_OR_DEPARTMENT_OTHER): Payer: Self-pay | Admitting: Physical Therapy

## 2021-12-18 DIAGNOSIS — M25611 Stiffness of right shoulder, not elsewhere classified: Secondary | ICD-10-CM

## 2021-12-18 DIAGNOSIS — M25511 Pain in right shoulder: Secondary | ICD-10-CM

## 2021-12-18 DIAGNOSIS — M6281 Muscle weakness (generalized): Secondary | ICD-10-CM

## 2021-12-18 NOTE — Therapy (Signed)
?OUTPATIENT PHYSICAL THERAPY SHOULDER Treatment  ? ?Patient Name: Timothy Griffith ?MRN: [id number] ?DOB:Jan 23, 1996, 26 y.o., male ?Today's Date: 12/18/2021 ? ? PT End of Session - 12/18/21 1450   ? ? Visit Number 8   ? Number of Visits 25   ? Date for PT Re-Evaluation 02/09/22   ? Authorization Type [employer]   ? PT Start Time 1430   ? PT Stop Time 1510   ? PT Time Calculation (min) 40 min   ? Activity Tolerance Patient tolerated treatment well   ? [facility] for tasks assessed/performed   ? ?  ?  ? ?  ? ? ? ? ?Past Medical History:  ?Diagnosis Date  ? Asthma   ? COVID-19   ? Jan 2021 and Jan 2022  ? Environmental allergies   ? ?Past Surgical History:  ?Procedure Laterality Date  ? SHOULDER ARTHROSCOPY WITH LABRAL REPAIR Right 11/07/2021  ? Procedure: RIGHT SHOULDER ARTHROSCOPY WITH LABRAL REPAIR;  Surgeon: Huel CoteBokshan, Steven, MD;  Location: [facility] OR;  Service: Orthopedics;  Laterality: Right;  ? ?Patient Active Problem List  ? Diagnosis Date Noted  ? Right shoulder pain 09/25/2021  ? Exercise-induced asthma 06/03/2012  ? Scoliosis 06/03/2012  ? ADD (attention deficit disorder with hyperactivity) 09/16/2011  ? ? ?PCP: Ardith DarkParker, Caleb M, MD ? ?REFERRING PROVIDER:Dr Huel CoteSteven Bokshan  ? ?REFERRING DIAG:  ? ?THERAPY DIAG:  ?Acute pain of right shoulder ? ?Stiffness of right shoulder, not elsewhere classified ? ?Muscle weakness (generalized) ? ? ?ONSET DATE: initial injury in 2017; Rt labral repair on 4/4 ? ?SUBJECTIVE:                                                                                                                                                                                     ? ?SUBJECTIVE STATEMENT: ?The patient has no complaints today. He had no soreness after the last visit. 5/8 ? ?PERTINENT HISTORY: ?asthma ? ?PAIN:  ?Are you having pain? No ? ?PRECAUTIONS: Other: labral repair ? ?WEIGHT BEARING RESTRICTIONS Yes shoulder ? ?FALLS:  ?Has patient fallen in last 6 months? No ? ?LIVING  ENVIRONMENT: ?Lives with: lives with their family ? ?OCCUPATION: ?Not working right now ? ?PLOF: Independent ? ?PATIENT GOALS playing sports ? ?OBJECTIVE:  ? ? ?PATIENT SURVEYS:  ?FOTO 22 ? ?COGNITION: ? Overall cognitive status: Within functional limits for tasks assessed ?    ?SENSATION: ?WFL ? ?POSTURE: ?Forward rounded shoulders as expected post op ? ?UPPER EXTREMITY ROM:  ? ?Passive ROM Right ?12/18/2021 Left ?12/18/2021  ?Shoulder flexion  120  ?Shoulder extension    ?Shoulder abduction    ?Shoulder adduction    ?Shoulder internal rotation    ?  Shoulder external rotation  26 ?  ?Elbow flexion    ?Elbow extension    ?Wrist flexion    ?Wrist extension    ?Wrist ulnar deviation    ?Wrist radial deviation    ?Wrist pronation    ?Wrist supination    ?(Blank rows = not tested) ? ?UPPER EXTREMITY MMT: 4/10 pt was very guarded in motion, was able to passively flex to 45 per visual inspection ? ?MMT Right ?12/18/2021   ?Shoulder flexion    ?Shoulder extension    ?Shoulder abduction    ?Shoulder adduction    ?Shoulder internal rotation    ?Shoulder external rotation    ?Middle trapezius    ?Lower trapezius    ?Elbow flexion    ?Elbow extension    ?Wrist flexion    ?Wrist extension    ?Wrist ulnar deviation    ?Wrist radial deviation    ?Wrist pronation    ?Wrist supination    ?Grip strength (lbs)    ?(Blank rows = not tested) ? ? ?  ?TODAY'S TREATMENT:  ?5/15 ?PROM: gentle PROM into all planes. Grade II and III posterior and inferior glides ? ?AAROM flexion 2x10 with wand ; ? ?ABC 1x no weight 1x 1lb weight  ? ?Side lying ER 3x10  ? ?Pulleys 2 min flexion  ?Pulleys 2 min scaption  ? ? ?Scap retraction 2x115 green  ?Shoulder extension 2x15 green  ? ?5/10 ?PROM: gentle PROM into all planes. Flexion to 90 degrees ER 15 degrees abduction 50 degrees  ? ?AAROM flexion 2x10 with wand ; ? ?ER with wand 2x10 5 sec hold with cuing for self stretch  ? ?Side lying ER 3x10  ? ?Scap retraction 2x115 green  ?Shoulder extension 2x15 green   ? ?Rythmic stabilization 2x10  ? ?Pulleys 2 min  ?Finger ladder 3x to 18  ?5/4 ?PROM: gentle PROM into all planes. Flexion to 90 degrees ER 15 degrees abduction 50 degrees  ? ?AAROM flexion 2x10 with wand ; ? ?ER with wand 2x10 5 sec hold with cuing for self stretch  ? ?Side lying ER 3x10  ? ?Scap retraction 2x115 red  ?Shoulder extension 2x15 red ? ?Rythmic stabilization 2x10  ? ?Pulleys 2 min  ?Finger ladder 3x  ? ?PATIENT EDUCATION: ?Education details: Anatomy of condition, POC, HEP, exercise form/rationale ? ?Person educated: Patient ?Education method: Explanation, Demonstration, Tactile cues, Verbal cues, and Handouts ?Education comprehension: verbalized understanding, returned demonstration, verbal cues required, tactile cues required, and needs further education ? ? ?HOME EXERCISE PROGRAM: ?[id number] ? ?ASSESSMENT: ? ?CLINICAL IMPRESSION: ?Therapy added light resistance to side lying ER and we also began supine ABC. He had full active flexion. He has limited ER but it is improving. We advanced his scapular exercises without pain. We will continue to progress as tolerated.  ? ?OBJECTIVE IMPAIRMENTS decreased activity tolerance, decreased ROM, decreased strength, increased muscle spasms, impaired flexibility, impaired UE functional use, improper body mechanics, postural dysfunction, and pain.  ? ?ACTIVITY LIMITATIONS cleaning, community activity, driving, meal prep, and yard work.  ? ?PERSONAL FACTORS 1-2 comorbidities: asthma, bilateral shoulder pain  are also affecting patient's functional outcome.  ? ? ?REHAB POTENTIAL: Good ? ?CLINICAL DECISION MAKING: Stable/uncomplicated ? ?EVALUATION COMPLEXITY: Low ? ? ?GOALS: ?Goals reviewed with patient? Yes ? ?SHORT TERM GOALS: Target date: 12/12/2021 ? ?Passive flexion to 120 ?Baseline: ?Goal status: INITIAL ? ?2.  Proper activation of scapular retraction ?Baseline:  ?Goal status: INITIAL ? ? ?LONG TERM GOALS: Target date: 02/06/2022 ? ?Able to  maneuver at least 5lb  through full ROM  ?Baseline:  ?Goal status: INITIAL ? ?2.  GHJ strength to 75% of opp UE via tindeq/dynamometry testing ?Baseline:  ?Goal status: INITIAL ? ?3.  Pt will meet FOTO goal ?Baseline:  ?Goal status: INITIAL ? ?4.  Further goals to be set regarding return to sport at this time ? ? ?PLAN: ?PT FREQUENCY: 1-2x/week ? ?PT DURATION: 12 weeks ? ?PLANNED INTERVENTIONS: Therapeutic exercises, Therapeutic activity, Neuromuscular re-education, Patient/Family education, Joint mobilization, Aquatic Therapy, Dry Needling, Electrical stimulation, Spinal mobilization, Cryotherapy, Moist heat, Taping, Traction, and Manual therapy ? ?PLAN FOR NEXT SESSION: continue PROM per protocol, scap mobility;  isometrics and AAROM. Continue with PROM in all planes.  ? ? ?Lorayne Bender PT DPT ?12/18/21 3:48 PM ?Phone: [phone] ?Fax: [phone]  ?

## 2021-12-20 ENCOUNTER — Encounter (HOSPITAL_BASED_OUTPATIENT_CLINIC_OR_DEPARTMENT_OTHER): Payer: Self-pay | Admitting: Physical Therapy

## 2021-12-20 ENCOUNTER — Ambulatory Visit (HOSPITAL_BASED_OUTPATIENT_CLINIC_OR_DEPARTMENT_OTHER): Payer: No Typology Code available for payment source | Admitting: Physical Therapy

## 2021-12-20 DIAGNOSIS — M25511 Pain in right shoulder: Secondary | ICD-10-CM | POA: Diagnosis not present

## 2021-12-20 DIAGNOSIS — M25611 Stiffness of right shoulder, not elsewhere classified: Secondary | ICD-10-CM

## 2021-12-20 DIAGNOSIS — M6281 Muscle weakness (generalized): Secondary | ICD-10-CM

## 2021-12-20 NOTE — Therapy (Incomplete)
?OUTPATIENT PHYSICAL THERAPY SHOULDER Treatment  ? ?Patient Name: Timothy LoftsRobert N Vancuren ?MRN: [id number] ?DOB:[birth year], 26 y.o., male ?Today's Date: 12/20/2021 ? ? PT End of Session - 12/20/21 1434   ? ? Visit Number 9   ? Number of Visits 25   ? Date for PT Re-Evaluation 02/09/22   ? Authorization Type Aetna   ? Activity Tolerance Patient tolerated treatment well   ? [facility] tasks assessed/performed   ? ?  ?  ? ?  ? ? ? ? ?Past Medical History:  ?Diagnosis Date  ? Asthma   ? COVID-19   ? Jan 2021 and Jan 2022  ? Environmental allergies   ? ?Past Surgical History:  ?Procedure Laterality Date  ? SHOULDER ARTHROSCOPY WITH LABRAL REPAIR Right 11/07/2021  ? Procedure: RIGHT SHOULDER ARTHROSCOPY WITH LABRAL REPAIR;  Surgeon: Huel CoteBokshan, Steven, MD;  Location: [facility] OR;  Service: Orthopedics;  Laterality: Right;  ? ?Patient Active Problem List  ? Diagnosis Date Noted  ? Right shoulder pain 09/25/2021  ? Exercise-induced asthma 06/03/2012  ? Scoliosis 06/03/2012  ? ADD (attention deficit disorder with hyperactivity) 09/16/2011  ? ? ?PCP: Ardith DarkParker, Caleb M, MD ? ?REFERRING PROVIDER:Dr Huel CoteSteven Bokshan  ? ?REFERRING DIAG:  ? ?THERAPY DIAG:  ?No diagnosis found. ? ? ?ONSET DATE: initial injury in 2017; Rt labral repair on 4/4 ? ?SUBJECTIVE:                                                                                                                                                                                     ? ?SUBJECTIVE STATEMENT: ?The patient has no complaints today. He had no soreness after the last visit. 5/8 ? ?PERTINENT HISTORY: ?asthma ? ?PAIN:  ?Are you having pain? No ? ?PRECAUTIONS: Other: labral repair ? ?WEIGHT BEARING RESTRICTIONS Yes shoulder ? ?FALLS:  ?Has patient fallen in last 6 months? No ? ?LIVING ENVIRONMENT: ?Lives with: lives with their family ? ?OCCUPATION: ?Not working right now ? ?PLOF: Independent ? ?PATIENT GOALS playing sports ? ?OBJECTIVE:  ? ? ?PATIENT SURVEYS:  ?FOTO  22 ? ?COGNITION: ? Overall cognitive status: Within functional limits for tasks assessed ?    ?SENSATION: ?WFL ? ?POSTURE: ?Forward rounded shoulders as expected post op ? ?UPPER EXTREMITY ROM:  ? ?Passive ROM Right ?12/20/2021 Left ?12/20/2021  ?Shoulder flexion  120  ?Shoulder extension    ?Shoulder abduction    ?Shoulder adduction    ?Shoulder internal rotation    ?Shoulder external rotation  26 ?  ?Elbow flexion    ?Elbow extension    ?Wrist flexion    ?Wrist extension    ?Wrist ulnar deviation    ?Wrist  radial deviation    ?Wrist pronation    ?Wrist supination    ?(Blank rows = not tested) ? ?UPPER EXTREMITY MMT: 4/10 pt was very guarded in motion, was able to passively flex to 45 per visual inspection ? ?MMT Right ?12/20/2021   ?Shoulder flexion    ?Shoulder extension    ?Shoulder abduction    ?Shoulder adduction    ?Shoulder internal rotation    ?Shoulder external rotation    ?Middle trapezius    ?Lower trapezius    ?Elbow flexion    ?Elbow extension    ?Wrist flexion    ?Wrist extension    ?Wrist ulnar deviation    ?Wrist radial deviation    ?Wrist pronation    ?Wrist supination    ?Grip strength (lbs)    ?(Blank rows = not tested) ? ? ?  ?TODAY'S TREATMENT:  ?5/17 ?PROM: gentle PROM into all planes. Grade II and III posterior and inferior glides ? ?AAROM flexion 2x10 with wand ; ? ?ABC 1x no weight 1x 1lb weight  ? ?5/15 ?PROM: gentle PROM into all planes. Grade II and III posterior and inferior glides ? ?AAROM flexion 2x10 with wand ; ? ?ABC 1x no weight 1x 1lb weight  ? ?Side lying ER 3x10  ? ?Pulleys 2 min flexion  ?Pulleys 2 min scaption  ? ? ?Scap retraction 2x115 green  ?Shoulder extension 2x15 green  ? ?5/10 ?PROM: gentle PROM into all planes. Flexion to 90 degrees ER 15 degrees abduction 50 degrees  ? ?AAROM flexion 2x10 with wand ; ? ?ER with wand 2x10 5 sec hold with cuing for self stretch  ? ?Side lying ER 3x10  ? ?Scap retraction 2x115 green  ?Shoulder extension 2x15 green  ? ?Rythmic stabilization  2x10  ? ?Pulleys 2 min  ?Finger ladder 3x to 18  ?5/4 ?PROM: gentle PROM into all planes. Flexion to 90 degrees ER 15 degrees abduction 50 degrees  ? ?AAROM flexion 2x10 with wand ; ? ?ER with wand 2x10 5 sec hold with cuing for self stretch  ? ?Side lying ER 3x10  ? ?Scap retraction 2x115 red  ?Shoulder extension 2x15 red ? ?Rythmic stabilization 2x10  ? ?Pulleys 2 min  ?Finger ladder 3x  ? ?PATIENT EDUCATION: ?Education details: Anatomy of condition, POC, HEP, exercise form/rationale ? ?Person educated: Patient ?Education method: Explanation, Demonstration, Tactile cues, Verbal cues, and Handouts ?Education comprehension: verbalized understanding, returned demonstration, verbal cues required, tactile cues required, and needs further education ? ? ?HOME EXERCISE PROGRAM: ?[id number] ? ?ASSESSMENT: ? ?CLINICAL IMPRESSION: ?Therapy added light resistance to side lying ER and we also began supine ABC. He had full active flexion. He has limited ER but it is improving. We advanced his scapular exercises without pain. We will continue to progress as tolerated.  ? ?OBJECTIVE IMPAIRMENTS decreased activity tolerance, decreased ROM, decreased strength, increased muscle spasms, impaired flexibility, impaired UE functional use, improper body mechanics, postural dysfunction, and pain.  ? ?ACTIVITY LIMITATIONS cleaning, community activity, driving, meal prep, and yard work.  ? ?PERSONAL FACTORS 1-2 comorbidities: asthma, bilateral shoulder pain  are also affecting patient's functional outcome.  ? ? ?REHAB POTENTIAL: Good ? ?CLINICAL DECISION MAKING: Stable/uncomplicated ? ?EVALUATION COMPLEXITY: Low ? ? ?GOALS: ?Goals reviewed with patient? Yes ? ?SHORT TERM GOALS: Target date: 12/12/2021 ? ?Passive flexion to 120 ?Baseline: ?Goal status: INITIAL ? ?2.  Proper activation of scapular retraction ?Baseline:  ?Goal status: INITIAL ? ? ?LONG TERM GOALS: Target date: 02/06/2022 ? ?Able to maneuver at  least 5lb through full ROM  ?Baseline:   ?Goal status: INITIAL ? ?2.  GHJ strength to 75% of opp UE via tindeq/dynamometry testing ?Baseline:  ?Goal status: INITIAL ? ?3.  Pt will meet FOTO goal ?Baseline:  ?Goal status: INITIAL ? ?4.  Further goals to be set regarding return to sport at this time ? ? ?PLAN: ?PT FREQUENCY: 1-2x/week ? ?PT DURATION: 12 weeks ? ?PLANNED INTERVENTIONS: Therapeutic exercises, Therapeutic activity, Neuromuscular re-education, Patient/Family education, Joint mobilization, Aquatic Therapy, Dry Needling, Electrical stimulation, Spinal mobilization, Cryotherapy, Moist heat, Taping, Traction, and Manual therapy ? ?PLAN FOR NEXT SESSION: continue PROM per protocol, scap mobility;  isometrics and AAROM. Continue with PROM in all planes.  ? ? ?Lorayne Bender PT DPT ?12/20/21 3:54 PM ?Phone: [phone] ?Fax: [phone]  ?

## 2021-12-20 NOTE — Therapy (Signed)
OUTPATIENT PHYSICAL THERAPY SHOULDER Treatment   Patient Name: Timothy Griffith MRN: [id number] DOB:Oct 12, 1995, 26 y.o., male Today's Date: 12/21/2021   PT End of Session - 12/20/21 1434     Visit Number 9    Number of Visits 25    Date for PT Re-Evaluation 02/09/22    Authorization Type Aetna    PT Start Time 1430    PT Stop Time 1512    PT Time Calculation (min) 42 min    Activity Tolerance Patient tolerated treatment well    Behavior [facility] for tasks assessed/performed               Past Medical History:  Diagnosis Date   Asthma    COVID-25 Aug 2019 and Jan 2022   Environmental allergies    Past Surgical History:  Procedure Laterality Date   SHOULDER ARTHROSCOPY WITH LABRAL REPAIR Right 11/07/2021   Procedure: RIGHT SHOULDER ARTHROSCOPY WITH LABRAL REPAIR;  Surgeon: Huel CoteBokshan, Steven, MD;  Location: [facility] OR;  Service: Orthopedics;  Laterality: Right;   Patient Active Problem List   Diagnosis Date Noted   Right shoulder pain 09/25/2021   Exercise-induced asthma 06/03/2012   Scoliosis 06/03/2012   ADD (attention deficit disorder with hyperactivity) 09/16/2011    PCP: Ardith DarkParker, Caleb M, MD  REFERRING PROVIDER:Dr Huel CoteSteven Bokshan   REFERRING DIAG:   THERAPY DIAG:  Acute pain of right shoulder  Stiffness of right shoulder, not elsewhere classified  Muscle weakness (generalized)   ONSET DATE: initial injury in 2017; Rt labral repair on 4/4  SUBJECTIVE:                                                                                                                                                                                      SUBJECTIVE STATEMENT: The patient continues to have no complaints   PERTINENT HISTORY: asthma  PAIN:  Are you having pain? No  PRECAUTIONS: Other: labral repair  WEIGHT BEARING RESTRICTIONS Yes shoulder  FALLS:  Has patient fallen in last 6 months? No  LIVING ENVIRONMENT: Lives with: lives with their  family  OCCUPATION: Not working right now  PLOF: Independent  PATIENT GOALS playing sports  OBJECTIVE:    PATIENT SURVEYS:  FOTO 22  COGNITION:  Overall cognitive status: Within functional limits for tasks assessed     SENSATION: WFL  POSTURE: Forward rounded shoulders as expected post op  UPPER EXTREMITY ROM:   Passive ROM Right 12/21/2021 Left 12/21/2021  Shoulder flexion  160  Shoulder extension    Shoulder abduction    Shoulder adduction    Shoulder internal rotation    Shoulder external rotation  45   Elbow flexion    Elbow extension    Wrist flexion    Wrist extension    Wrist ulnar deviation    Wrist radial deviation    Wrist pronation    Wrist supination    (Blank rows = not tested)  UPPER EXTREMITY MMT: 4/10 pt was very guarded in motion, was able to passively flex to 45 per visual inspection  MMT Right 12/21/2021   Shoulder flexion    Shoulder extension    Shoulder abduction    Shoulder adduction    Shoulder internal rotation    Shoulder external rotation    Middle trapezius    Lower trapezius    Elbow flexion    Elbow extension    Wrist flexion    Wrist extension    Wrist ulnar deviation    Wrist radial deviation    Wrist pronation    Wrist supination    Grip strength (lbs)    (Blank rows = not tested)     TODAY'S TREATMENT:   5/17  PROM: gentle PROM into all planes. Grade II and III posterior and inferior glides  AAROM flexion 2x10 with wand ;  ABC 1x no weight 1x 2lb weight   Side lying ER 3x10   Pulleys 2 min flexion  Pulleys 1 min scaption    Scap retraction cable 2x15 15 lbs   Shoulder extension 2x15 15 lbs cable   Standing flexion 2x10   Standing scaption 2x 10 both in the mirror without pain   5/15 PROM: gentle PROM into all planes. Grade II and III posterior and inferior glides  AAROM flexion 2x10 with wand ;  ABC 1x no weight 1x 1lb weight   Side lying ER 3x10   Pulleys 2 min flexion  Pulleys 2  min scaption    Scap retraction 2x115 green  Shoulder extension 2x15 green   5/10 PROM: gentle PROM into all planes. Flexion to 90 degrees ER 15 degrees abduction 50 degrees   AAROM flexion 2x10 with wand ;  ER with wand 2x10 5 sec hold with cuing for self stretch   Side lying ER 3x10   Scap retraction 2x115 green  Shoulder extension 2x15 green   Rythmic stabilization 2x10   Pulleys 2 min  Finger ladder 3x to 18    PATIENT EDUCATION: Education details: Teacher, music of condition, POC, HEP, exercise form/rationale  Person educated: Patient Education method: Explanation, Demonstration, Tactile cues, Verbal cues, and Handouts Education comprehension: verbalized understanding, returned demonstration, verbal cues required, tactile cues required, and needs further education   HOME EXERCISE PROGRAM: [id number]  ASSESSMENT:  CLINICAL IMPRESSION: The patient continues to have mild limitations in ER. He is guarded at end range. He has better active motion then passive motion. We will continue to advance active motion as tolerated. He had progressed well with his scpaular strengthening exercises. We advanced him to cable exercises for scapula. His weights were kept low. He was advised to advance slowly.  OBJECTIVE IMPAIRMENTS decreased activity tolerance, decreased ROM, decreased strength, increased muscle spasms, impaired flexibility, impaired UE functional use, improper body mechanics, postural dysfunction, and pain.   ACTIVITY LIMITATIONS cleaning, community activity, driving, meal prep, and yard work.   PERSONAL FACTORS 1-2 comorbidities: asthma, bilateral shoulder pain  are also affecting patient's functional outcome.    REHAB POTENTIAL: Good  CLINICAL DECISION MAKING: Stable/uncomplicated  EVALUATION COMPLEXITY: Low   GOALS: Goals reviewed with patient? Yes  SHORT TERM GOALS: Target date: 12/12/2021  Passive  flexion to 120 Baseline: Goal status: INITIAL  2.  Proper  activation of scapular retraction Baseline:  Goal status: INITIAL   LONG TERM GOALS: Target date: 02/06/2022  Able to maneuver at least 5lb through full ROM  Baseline:  Goal status: INITIAL  2.  GHJ strength to 75% of opp UE via tindeq/dynamometry testing Baseline:  Goal status: INITIAL  3.  Pt will meet FOTO goal Baseline:  Goal status: INITIAL  4.  Further goals to be set regarding return to sport at this time   PLAN: PT FREQUENCY: 1-2x/week  PT DURATION: 12 weeks  PLANNED INTERVENTIONS: Therapeutic exercises, Therapeutic activity, Neuromuscular re-education, Patient/Family education, Joint mobilization, Aquatic Therapy, Dry Needling, Electrical stimulation, Spinal mobilization, Cryotherapy, Moist heat, Taping, Traction, and Manual therapy  PLAN FOR NEXT SESSION: continue PROM per protocol, scap mobility; continue to progress as tolerated.    Lorayne Bender PT DPT 12/21/21 8:46 AM Phone: [phone] Fax: [phone]

## 2021-12-21 ENCOUNTER — Encounter (HOSPITAL_BASED_OUTPATIENT_CLINIC_OR_DEPARTMENT_OTHER): Payer: Self-pay | Admitting: Physical Therapy

## 2021-12-26 ENCOUNTER — Ambulatory Visit (HOSPITAL_BASED_OUTPATIENT_CLINIC_OR_DEPARTMENT_OTHER): Payer: No Typology Code available for payment source | Admitting: Physical Therapy

## 2021-12-26 ENCOUNTER — Encounter (HOSPITAL_BASED_OUTPATIENT_CLINIC_OR_DEPARTMENT_OTHER): Payer: Self-pay | Admitting: Physical Therapy

## 2021-12-26 DIAGNOSIS — M6281 Muscle weakness (generalized): Secondary | ICD-10-CM

## 2021-12-26 DIAGNOSIS — M25511 Pain in right shoulder: Secondary | ICD-10-CM

## 2021-12-26 DIAGNOSIS — M25611 Stiffness of right shoulder, not elsewhere classified: Secondary | ICD-10-CM

## 2021-12-26 NOTE — Therapy (Signed)
OUTPATIENT PHYSICAL THERAPY SHOULDER Treatment   Patient Name: Timothy Griffith MRN: [id number] DOB:05-25-1996, 26 y.o., male Today's Date: 12/26/2021   PT End of Session - 12/26/21 1522     Visit Number 10    Number of Visits 25    Date for PT Re-Evaluation 02/09/22    Authorization Type [employer]etna    PT Start Time 1522    PT Stop Time 1600    PT Time Calculation (min) 38 min    Activity Tolerance Patient tolerated treatment well    Behavior During Therapy [facility] for tasks assessed/performed               Past Medical History:  Diagnosis Date   Asthma    COVID-25 Aug 2019 and Jan 2022   Environmental allergies    Past Surgical History:  Procedure Laterality Date   SHOULDER ARTHROSCOPY WITH LABRAL REPAIR Right 11/07/2021   Procedure: RIGHT SHOULDER ARTHROSCOPY WITH LABRAL REPAIR;  Surgeon: [name], MD;  Location: [facility] OR;  Service: Orthopedics;  Laterality: Right;   Patient Active Problem List   Diagnosis Date Noted   Right shoulder pain 09/25/2021   Exercise-induced asthma 06/03/2012   Scoliosis 06/03/2012   ADD (attention deficit disorder with hyperactivity) 09/16/2011    PCP: Ardith Dark, MD  REFERRING PROVIDER:Dr [name]   REFERRING DIAG:   THERAPY DIAG:  Acute pain of right shoulder  Stiffness of right shoulder, not elsewhere classified  Muscle weakness (generalized)   ONSET DATE: initial injury in 2017; Rt labral repair on 4/4  SUBJECTIVE:                                                                                                                                                                                      SUBJECTIVE STATEMENT: Denies pain, exercises are doing well.  7 weeks post op today.   PERTINENT HISTORY: asthma  PAIN:  Are you having pain? No  PRECAUTIONS: Other: labral repair  WEIGHT BEARING RESTRICTIONS Yes shoulder  FALLS:  Has patient fallen in last 6 months? No  LIVING ENVIRONMENT: Lives with: lives  with their family  OCCUPATION: Not working right now  PLOF: Independent  PATIENT GOALS playing sports  OBJECTIVE:    PATIENT SURVEYS:  FOTO 22  COGNITION:  Overall cognitive status: Within functional limits for tasks assessed     SENSATION: WFL  POSTURE: Forward rounded shoulders as expected post op  UPPER EXTREMITY ROM:   Passive ROM Left 12/26/2021  Shoulder flexion 130- supine  Shoulder extension   Shoulder abduction   Shoulder adduction   Shoulder internal rotation   Shoulder external rotation 45  Elbow flexion   Elbow extension   Wrist flexion   Wrist extension   Wrist ulnar deviation   Wrist radial deviation   Wrist pronation   Wrist supination   (Blank rows = not tested)  UPPER EXTREMITY MMT: 4/10 pt was very guarded in motion, was able to passively flex to 45 per visual inspection  MMT Right    Shoulder flexion    Shoulder extension    Shoulder abduction    Shoulder adduction    Shoulder internal rotation    Shoulder external rotation    Middle trapezius    Lower trapezius    Elbow flexion    Elbow extension    Wrist flexion    Wrist extension    Wrist ulnar deviation    Wrist radial deviation    Wrist pronation    Wrist supination    Grip strength (lbs)    (Blank rows = not tested)     TODAY'S TREATMENT:   5/23: MANUAL PROM flexion, STM upper trap, subscap; supine AP mobs GHJ Supine ABCs x3, 1lb weight Pulleys- flexion & scaption 10x5s hold each at end range AROM in mirror Wall sit AROM- flexion & scaption Rows blue tband with core MANUAL end of session to Rt levator scap   5/17  PROM: gentle PROM into all planes. Grade II and III posterior and inferior glides  AAROM flexion 2x10 with wand ;  ABC 1x no weight 1x 2lb weight   Side lying ER 3x10   Pulleys 2 min flexion  Pulleys 1 min scaption    Scap retraction cable 2x15 15 lbs   Shoulder extension 2x15 15 lbs cable   Standing flexion 2x10   Standing  scaption 2x 10 both in the mirror without pain   5/15 PROM: gentle PROM into all planes. Grade II and III posterior and inferior glides  AAROM flexion 2x10 with wand ;  ABC 1x no weight 1x 1lb weight   Side lying ER 3x10   Pulleys 2 min flexion  Pulleys 2 min scaption    Scap retraction 2x115 green  Shoulder extension 2x15 green      PATIENT EDUCATION: Education details: Anatomy of condition, POC, HEP, exercise form/rationale  Person educated: Patient Education method: Explanation, Demonstration, Tactile cues, Verbal cues, and Handouts Education comprehension: verbalized understanding, returned demonstration, verbal cues required, tactile cues required, and needs further education   HOME EXERCISE PROGRAM: [id number]  ASSESSMENT:  CLINICAL IMPRESSION: Pt cont to demo excellent progression. Focused on scapular control today in higher ranges of motion as well as reducing use of lumbar hyperextension to achieve a higher reach.   OBJECTIVE IMPAIRMENTS decreased activity tolerance, decreased ROM, decreased strength, increased muscle spasms, impaired flexibility, impaired UE functional use, improper body mechanics, postural dysfunction, and pain.   ACTIVITY LIMITATIONS cleaning, community activity, driving, meal prep, and yard work.   PERSONAL FACTORS 1-2 comorbidities: asthma, bilateral shoulder pain  are also affecting patient's functional outcome.    REHAB POTENTIAL: Good  CLINICAL DECISION MAKING: Stable/uncomplicated  EVALUATION COMPLEXITY: Low   GOALS: Goals reviewed with patient? Yes  SHORT TERM GOALS: Target date: 12/12/2021  Passive flexion to 120 Baseline: Goal status: achieved  2.  Proper activation of scapular retraction Baseline:  Goal status: achieved   LONG TERM GOALS: Target date: 02/06/2022  Able to maneuver at least 5lb through full ROM  Baseline:  Goal status: INITIAL  2.  GHJ strength to 75% of opp UE via tindeq/dynamometry  testing Baseline:  Goal  status: INITIAL  3.  Pt will meet FOTO goal Baseline:  Goal status: INITIAL  4.  Further goals to be set regarding return to sport at this time   PLAN: PT FREQUENCY: 1-2x/week  PT DURATION: 12 weeks  PLANNED INTERVENTIONS: Therapeutic exercises, Therapeutic activity, Neuromuscular re-education, Patient/Family education, Joint mobilization, Aquatic Therapy, Dry Needling, Electrical stimulation, Spinal mobilization, Cryotherapy, Moist heat, Taping, Traction, and Manual therapy  PLAN FOR NEXT SESSION: continue PROM per protocol, scap mobility; continue to progress as tolerated.   Sahmya Arai C. Cana Mignano PT, DPT 12/26/21 7:57 PM

## 2021-12-27 ENCOUNTER — Ambulatory Visit (INDEPENDENT_AMBULATORY_CARE_PROVIDER_SITE_OTHER): Payer: No Typology Code available for payment source | Admitting: Orthopaedic Surgery

## 2021-12-27 DIAGNOSIS — M25311 Other instability, right shoulder: Secondary | ICD-10-CM

## 2021-12-27 NOTE — Progress Notes (Signed)
Post Operative Evaluation    Procedure/Date of Surgery: right shoulder labral repair 11/07/21  Interval History:   Presents today for 6-week follow-up following a right shoulder labral repair.  Overall he is doing very well.  He has been working on active range of motion with physical therapy.  This is going quite well.  He states he has not had any pain in the shoulder he has no evidence of recurring instability.   PMH/PSH/Family History/Social History/Meds/Allergies:    Past Medical History:  Diagnosis Date   Asthma    COVID-25 Aug 2019 and Jan 2022   Environmental allergies    Past Surgical History:  Procedure Laterality Date   SHOULDER ARTHROSCOPY WITH LABRAL REPAIR Right 11/07/2021   Procedure: RIGHT SHOULDER ARTHROSCOPY WITH LABRAL REPAIR;  Surgeon: Vanetta Mulders, MD;  Location: [facility];  Service: Orthopedics;  Laterality: Right;   Social History   Socioeconomic History   Marital status: Single    Spouse name: Not on file   Number of children: Not on file   Years of education: Not on file   Highest education level: Not on file  Occupational History   Not on file  Tobacco Use   Smoking status: Never   Smokeless tobacco: Never  Substance and Sexual Activity   Alcohol use: No   Drug use: No   Sexual activity: Never  Other Topics Concern   Not on file  Social History Narrative   Not on file   Social Determinants of Health   Financial Resource Strain: Not on file  Food Insecurity: Not on file  Transportation Needs: Not on file  Physical Activity: Not on file  Stress: Not on file  Social Connections: Not on file   Family History  Problem Relation Age of Onset   Hypertension Father    Hyperlipidemia Father    Hypertension Paternal Grandfather    Hyperlipidemia Paternal Grandfather    Allergies  Allergen Reactions   Cephalosporins     Unknown reaction   Crab (Diagnostic) Itching   Current Outpatient Medications   Medication Sig Dispense Refill   albuterol (VENTOLIN HFA) 108 (90 Base) MCG/ACT inhaler Inhale 2 puffs into the lungs every 6 (six) hours as needed for wheezing. 1 each 3   aspirin EC 325 MG tablet Take 1 tablet (325 mg total) by mouth daily. 30 tablet 0   oxyCODONE (OXY IR/ROXICODONE) 5 MG immediate release tablet Take 1 tablet (5 mg total) by mouth every 4 (four) hours as needed (severe pain). 20 tablet 0   No current facility-administered medications for this visit.   No results found.  Review of Systems:   A ROS was performed including pertinent positives and negatives as documented in the HPI.   Musculoskeletal Exam:    There were no vitals taken for this visit.  Right hip portals are well-healing.  No erythema or drainage.  He is able to flex and extend at the right elbow.  Active forward elevation is to 120 degrees without pain.  External rotation at the side is to 60 degrees.  Internal rotation is to L1 bilaterally.  2+ radial pulse.  Imaging:    none  I personally reviewed and interpreted the radiographs.   Assessment:   6 weeks status post right shoulder labral repair.  Overall he  is doing very well.  He will continue to advance his active range of motion at this time.  Once he is completed active range of motion he will work on additional strengthening.  I will see him back in 6 weeks for reassessment.  Continue to advance per protocol  Plan :    -Return to clinic 6 weeks for reassessment      I personally saw and evaluated the patient, and participated in the management and treatment plan.  Vanetta Mulders, MD Attending Physician, Orthopedic Surgery  This document was dictated using Dragon voice recognition software. A reasonable attempt at proof reading has been made to minimize errors.

## 2022-01-02 ENCOUNTER — Ambulatory Visit (HOSPITAL_BASED_OUTPATIENT_CLINIC_OR_DEPARTMENT_OTHER): Payer: No Typology Code available for payment source | Admitting: Physical Therapy

## 2022-01-02 DIAGNOSIS — M25511 Pain in right shoulder: Secondary | ICD-10-CM

## 2022-01-02 DIAGNOSIS — M6281 Muscle weakness (generalized): Secondary | ICD-10-CM

## 2022-01-02 DIAGNOSIS — M25611 Stiffness of right shoulder, not elsewhere classified: Secondary | ICD-10-CM

## 2022-01-02 NOTE — Therapy (Unsigned)
OUTPATIENT PHYSICAL THERAPY SHOULDER Treatment   Patient Name: Timothy Griffith MRN: [id number] DOB:1996-03-13, 26 y.o., male Today's Date: 01/02/2022       Past Medical History:  Diagnosis Date   Asthma    COVID-25 Aug 2019 and Jan 2022   Environmental allergies    Past Surgical History:  Procedure Laterality Date   SHOULDER ARTHROSCOPY WITH LABRAL REPAIR Right 11/07/2021   Procedure: RIGHT SHOULDER ARTHROSCOPY WITH LABRAL REPAIR;  Surgeon: [name], MD;  Location: [facility];  Service: Orthopedics;  Laterality: Right;   Patient Active Problem List   Diagnosis Date Noted   Right shoulder pain 09/25/2021   Exercise-induced asthma 06/03/2012   Scoliosis 06/03/2012   ADD (attention deficit disorder with hyperactivity) 09/16/2011    PCP: Ardith Dark, MD  REFERRING PROVIDER:Dr [name]   REFERRING DIAG:   THERAPY DIAG:  No diagnosis found.   ONSET DATE: initial injury in 2017; Rt labral repair on 4/4  SUBJECTIVE:                                                                                                                                                                                      SUBJECTIVE STATEMENT: Patient reports no pain. He has been to the MD who wants him to progress his strengthening.   PERTINENT HISTORY: asthma  PAIN:  Are you having pain? No  PRECAUTIONS: Other: labral repair  WEIGHT BEARING RESTRICTIONS Yes shoulder  FALLS:  Has patient fallen in last 6 months? No  LIVING ENVIRONMENT: Lives with: lives with their family  OCCUPATION: Not working right now  PLOF: Independent  PATIENT GOALS playing sports  OBJECTIVE:    PATIENT SURVEYS:  FOTO 22  COGNITION:  Overall cognitive status: Within functional limits for tasks assessed     SENSATION: WFL  POSTURE: Forward rounded shoulders as expected post op  UPPER EXTREMITY ROM:   Passive ROM Left 01/02/2022  Shoulder flexion 150 per visual inspection    Shoulder extension   Shoulder abduction   Shoulder adduction   Shoulder internal rotation   Shoulder external rotation 45   Elbow flexion   Elbow extension   Wrist flexion   Wrist extension   Wrist ulnar deviation   Wrist radial deviation   Wrist pronation   Wrist supination   (Blank rows = not tested)  UPPER EXTREMITY MMT: 4/10 pt was very guarded in motion, was able to passively flex to 45 per visual inspection  MMT Right    Shoulder flexion    Shoulder extension    Shoulder abduction    Shoulder adduction    Shoulder internal rotation  Shoulder external rotation    Middle trapezius    Lower trapezius    Elbow flexion    Elbow extension    Wrist flexion    Wrist extension    Wrist ulnar deviation    Wrist radial deviation    Wrist pronation    Wrist supination    Grip strength (lbs)    (Blank rows = not tested)     TODAY'S TREATMENT:   PROM: gentle PROM into all planes. Grade II and III posterior and inferior glides  AROM flexion supine x12  ABC 2x 2lb weight   Prone Row x20 4 lbs  Prone extension x20 4lbs  Supine flexion x15 1lb  Side lying ER 3x10   Pulleys 2 min flexion  Pulleys 1 min scaption    Scap retraction cable 2x15 15 lbs   Shoulder extension 2x15 15 lbs cable   Standing flexion 2x10   Standing scaption 2x 10 both in the mirror without pain    5/23: MANUAL PROM flexion, STM upper trap, subscap; supine AP mobs GHJ Supine ABCs x3, 1lb weight Pulleys- flexion & scaption 10x5s hold each at end range AROM in mirror Wall sit AROM- flexion & scaption Rows blue tband with core MANUAL end of session to Rt levator scap     PATIENT EDUCATION: Education details: Anatomy of condition, POC, HEP, exercise form/rationale  Person educated: Patient Education method: Explanation, Demonstration, Tactile cues, Verbal cues, and Handouts Education comprehension: verbalized understanding, returned demonstration, verbal cues required,  tactile cues required, and needs further education   HOME EXERCISE PROGRAM: [id number]  ASSESSMENT:  CLINICAL IMPRESSION: Therapy advanced supine flexion and sidelying ER weight to a 1 lb weight. He had no increase in pain. His flexion was full today without hitting end range. His ER is still limited but improving. Therapy will continue to progress as tolerated.  OBJECTIVE IMPAIRMENTS decreased activity tolerance, decreased ROM, decreased strength, increased muscle spasms, impaired flexibility, impaired UE functional use, improper body mechanics, postural dysfunction, and pain.   ACTIVITY LIMITATIONS cleaning, community activity, driving, meal prep, and yard work.   PERSONAL FACTORS 1-2 comorbidities: asthma, bilateral shoulder pain  are also affecting patient's functional outcome.    REHAB POTENTIAL: Good  CLINICAL DECISION MAKING: Stable/uncomplicated  EVALUATION COMPLEXITY: Low   GOALS: Goals reviewed with patient? Yes  SHORT TERM GOALS: Target date: 12/12/2021  Passive flexion to 120 Baseline: Goal status: achieved  2.  Proper activation of scapular retraction Baseline:  Goal status: achieved   LONG TERM GOALS: Target date: 02/06/2022  Able to maneuver at least 5lb through full ROM  Baseline:  Goal status: INITIAL  2.  GHJ strength to 75% of opp UE via tindeq/dynamometry testing Baseline:  Goal status: INITIAL  3.  Pt will meet FOTO goal Baseline:  Goal status: INITIAL  4.  Further goals to be set regarding return to sport at this time   PLAN: PT FREQUENCY: 1-2x/week  PT DURATION: 12 weeks  PLANNED INTERVENTIONS: Therapeutic exercises, Therapeutic activity, Neuromuscular re-education, Patient/Family education, Joint mobilization, Aquatic Therapy, Dry Needling, Electrical stimulation, Spinal mobilization, Cryotherapy, Moist heat, Taping, Traction, and Manual therapy  PLAN FOR NEXT SESSION: continue PROM per protocol, scap mobility; continue to progress as  tolerated.   Lorayne Bender PT, DPT 01/02/22 2:36 PM

## 2022-01-03 ENCOUNTER — Encounter (HOSPITAL_BASED_OUTPATIENT_CLINIC_OR_DEPARTMENT_OTHER): Payer: Self-pay | Admitting: Physical Therapy

## 2022-01-05 ENCOUNTER — Ambulatory Visit (HOSPITAL_BASED_OUTPATIENT_CLINIC_OR_DEPARTMENT_OTHER): Payer: No Typology Code available for payment source | Attending: Orthopaedic Surgery | Admitting: Physical Therapy

## 2022-01-05 ENCOUNTER — Encounter (HOSPITAL_BASED_OUTPATIENT_CLINIC_OR_DEPARTMENT_OTHER): Payer: Self-pay | Admitting: Physical Therapy

## 2022-01-05 DIAGNOSIS — M6281 Muscle weakness (generalized): Secondary | ICD-10-CM

## 2022-01-05 DIAGNOSIS — M25511 Pain in right shoulder: Secondary | ICD-10-CM | POA: Diagnosis present

## 2022-01-05 DIAGNOSIS — M25611 Stiffness of right shoulder, not elsewhere classified: Secondary | ICD-10-CM

## 2022-01-05 NOTE — Therapy (Signed)
OUTPATIENT PHYSICAL THERAPY SHOULDER Treatment   Patient Name: Timothy Griffith MRN: [id number] DOB:October 26, 1995, 26 y.o., male Today's Date: 6/2//2023   PT End of Session - 01/07/22 0829     Number of Visits 25    Date for PT Re-Evaluation 02/09/22    Authorization Type [employer]    PT Start Time 1430    PT Stop Time 1509    PT Time Calculation (min) 39 min    Activity Tolerance Patient tolerated treatment well    Behavior During Therapy [facility] for tasks assessed/performed                 Past Medical History:  Diagnosis Date   Asthma    COVID-25 Aug 2019 and Jan 2022   Environmental allergies    Past Surgical History:  Procedure Laterality Date   SHOULDER ARTHROSCOPY WITH LABRAL REPAIR Right 11/07/2021   Procedure: RIGHT SHOULDER ARTHROSCOPY WITH LABRAL REPAIR;  Surgeon: Vanetta Mulders, MD;  Location: [facility];  Service: Orthopedics;  Laterality: Right;   Patient Active Problem List   Diagnosis Date Noted   Right shoulder pain 09/25/2021   Exercise-induced asthma 06/03/2012   Scoliosis 06/03/2012   ADD (attention deficit disorder with hyperactivity) 09/16/2011    PCP: Vivi Barrack, MD  REFERRING PROVIDER:Dr Vanetta Mulders   REFERRING DIAG: Rt labral repair on 4/4  THERAPY DIAG:  Acute pain of right shoulder  Stiffness of right shoulder, not elsewhere classified  Muscle weakness (generalized)   ONSET DATE: initial injury in 2017; Rt labral repair on 4/4  SUBJECTIVE:                                                                                                                                                                                      SUBJECTIVE STATEMENT: Patient reports shoulder is feeling great and no pain, just frequent fatigue during ADLs.  PERTINENT HISTORY: asthma  PAIN:  Are you having pain? No  PRECAUTIONS: Other: labral repair  WEIGHT BEARING RESTRICTIONS Yes shoulder  FALLS:  Has patient fallen in last 6 months? No  LIVING  ENVIRONMENT: Lives with: lives with their family  OCCUPATION: Not working right now  PLOF: Independent  PATIENT GOALS playing sports  OBJECTIVE:    PATIENT SURVEYS:  FOTO 22  COGNITION:  Overall cognitive status: Within functional limits for tasks assessed     SENSATION: WFL  POSTURE: Forward rounded shoulders as expected post op  UPPER EXTREMITY ROM:   Passive ROM Left 01/02/2022  Shoulder flexion 150 per visual inspection   Shoulder extension   Shoulder abduction   Shoulder adduction   Shoulder internal rotation   Shoulder  external rotation 45   Elbow flexion   Elbow extension   Wrist flexion   Wrist extension   Wrist ulnar deviation   Wrist radial deviation   Wrist pronation   Wrist supination   (Blank rows = not tested)  UPPER EXTREMITY MMT: 4/10 pt was very guarded in motion, was able to passively flex to 45 per visual inspection  MMT Right    Shoulder flexion    Shoulder extension    Shoulder abduction    Shoulder adduction    Shoulder internal rotation    Shoulder external rotation    Middle trapezius    Lower trapezius    Elbow flexion    Elbow extension    Wrist flexion    Wrist extension    Wrist ulnar deviation    Wrist radial deviation    Wrist pronation    Wrist supination    Grip strength (lbs)    (Blank rows = not tested)     TODAY'S TREATMENT:   6/2 Ube no resistance 2 min forward and 2 min back   PROM in flexion and ER with grade II/III AP mobs  Supine ABC 2x 2lb weight  Supine flexion 2x10 2lb Side lying ER 3x10 2 lb   Scap star 2x8 each direction, red band  Scap retraction cable 2x10 20 lbs  Shoulder extension cable 2x10 20 lbs  5/31  PROM: gentle PROM into all planes. Grade II and III posterior and inferior glides  AROM flexion supine x12  ABC 2x 2lb weight   Prone Row x20 4 lbs  Prone extension x20 4lbs  Supine flexion x15 1lb  Side lying ER 3x10 1 lb   Pulleys 2 min flexion  Pulleys 1 min scaption     Scap retraction cable 2x15 20 lbs   Shoulder extension cable 2x15 20 lbs Standing flexion 2x10   Standing scaption 2x 10 both in the mirror without pain    5/23: MANUAL PROM flexion, STM upper trap, subscap; supine AP mobs GHJ Supine ABCs x3, 1lb weight Pulleys- flexion & scaption 10x5s hold each at end range AROM in mirror Wall sit AROM- flexion & scaption Rows blue tband with core MANUAL end of session to Rt levator scap     PATIENT EDUCATION: Education details: Anatomy of condition, POC, HEP, exercise form/rationale  Person educated: Patient Education method: Explanation, Demonstration, Tactile cues, Verbal cues, and Handouts Education comprehension: verbalized understanding, returned demonstration, verbal cues required, tactile cues required, and needs further education   HOME EXERCISE PROGRAM: [id number]  ASSESSMENT:  CLINICAL IMPRESSION: Pt able to increase resistance during exercises today without any increase in pain. Pt is showing improvements in flexion and ER ROM with muscle guarding continuing to limit range. Pt performs scap stability exercise with mod fatigue reported following. Pt will continue to benefit from skilled therapy to further improve range, strength, stability, and function.  OBJECTIVE IMPAIRMENTS decreased activity tolerance, decreased ROM, decreased strength, increased muscle spasms, impaired flexibility, impaired UE functional use, improper body mechanics, postural dysfunction, and pain.   ACTIVITY LIMITATIONS cleaning, community activity, driving, meal prep, and yard work.   PERSONAL FACTORS 1-2 comorbidities: asthma, bilateral shoulder pain  are also affecting patient's functional outcome.    REHAB POTENTIAL: Good  CLINICAL DECISION MAKING: Stable/uncomplicated  EVALUATION COMPLEXITY: Low   GOALS: Goals reviewed with patient? Yes  SHORT TERM GOALS: Target date: 12/12/2021  Passive flexion to 120 Baseline: Goal status:  achieved  2.  Proper activation of scapular retraction Baseline:  Goal status: achieved   LONG TERM GOALS: Target date: 02/06/2022  Able to maneuver at least 5lb through full ROM  Baseline:  Goal status: INITIAL  2.  GHJ strength to 75% of opp UE via tindeq/dynamometry testing Baseline:  Goal status: INITIAL  3.  Pt will meet FOTO goal Baseline:  Goal status: INITIAL  4.  Further goals to be set regarding return to sport at this time   PLAN: PT FREQUENCY: 1-2x/week  PT DURATION: 12 weeks  PLANNED INTERVENTIONS: Therapeutic exercises, Therapeutic activity, Neuromuscular re-education, Patient/Family education, Joint mobilization, Aquatic Therapy, Dry Needling, Electrical stimulation, Spinal mobilization, Cryotherapy, Moist heat, Taping, Traction, and Manual therapy  PLAN FOR NEXT SESSION: Continue AROM, shoulder strengthening, scap stability,   Carolyne Littles PT, DPT 01/07/22 8:29 AM

## 2022-01-07 ENCOUNTER — Encounter (HOSPITAL_BASED_OUTPATIENT_CLINIC_OR_DEPARTMENT_OTHER): Payer: Self-pay | Admitting: Physical Therapy

## 2022-01-08 ENCOUNTER — Ambulatory Visit (HOSPITAL_BASED_OUTPATIENT_CLINIC_OR_DEPARTMENT_OTHER): Payer: No Typology Code available for payment source | Admitting: Physical Therapy

## 2022-01-08 ENCOUNTER — Encounter (HOSPITAL_BASED_OUTPATIENT_CLINIC_OR_DEPARTMENT_OTHER): Payer: Self-pay | Admitting: Physical Therapy

## 2022-01-08 DIAGNOSIS — M25511 Pain in right shoulder: Secondary | ICD-10-CM

## 2022-01-08 DIAGNOSIS — M25611 Stiffness of right shoulder, not elsewhere classified: Secondary | ICD-10-CM

## 2022-01-08 DIAGNOSIS — M6281 Muscle weakness (generalized): Secondary | ICD-10-CM

## 2022-01-08 NOTE — Therapy (Signed)
OUTPATIENT PHYSICAL THERAPY SHOULDER Treatment   Patient Name: Timothy LoftsRobert N Griffith MRN: [id number] DOB:05/04/1996, 26 y.o., male Today's Date: 6/2//2023   PT End of Session - 01/08/22 1557     Visit Number 13    Number of Visits 25    Date for PT Re-Evaluation 02/09/22    Authorization Type [employer]    PT Start Time 1519    PT Stop Time 1557    PT Time Calculation (min) 38 min    Activity Tolerance Patient tolerated treatment well    [facility] for tasks assessed/performed                  Past Medical History:  Diagnosis Date   Asthma    COVID-25 Aug 2019 and Jan 2022   Environmental allergies    Past Surgical History:  Procedure Laterality Date   SHOULDER ARTHROSCOPY WITH LABRAL REPAIR Right 11/07/2021   Procedure: RIGHT SHOULDER ARTHROSCOPY WITH LABRAL REPAIR;  Surgeon: Huel CoteBokshan, Steven, MD;  Location: [facility];  Service: Orthopedics;  Laterality: Right;   Patient Active Problem List   Diagnosis Date Noted   Right shoulder pain 09/25/2021   Exercise-induced asthma 06/03/2012   Scoliosis 06/03/2012   ADD (attention deficit disorder with hyperactivity) 09/16/2011    PCP: Ardith DarkParker, Caleb M, MD  REFERRING PROVIDER:Dr Huel CoteSteven Bokshan   REFERRING DIAG: Rt labral repair on 4/4  THERAPY DIAG:  Acute pain of right shoulder  Stiffness of right shoulder, not elsewhere classified  Muscle weakness (generalized)   ONSET DATE: initial injury in 2017; Rt labral repair on 4/4  SUBJECTIVE:                                                                                                                                                                                      SUBJECTIVE STATEMENT: Patient reports shoulder is feeling great and no pain, just frequent fatigue during ADLs.  PERTINENT HISTORY: asthma  PAIN:  Are you having pain? No  PRECAUTIONS: Other: labral repair  WEIGHT BEARING RESTRICTIONS Yes shoulder  FALLS:  Has patient fallen in last 6  months? No  LIVING ENVIRONMENT: Lives with: lives with their family  OCCUPATION: Not working right now  PLOF: Independent  PATIENT GOALS playing sports  OBJECTIVE:    PATIENT SURVEYS:  FOTO 22    POSTURE: Forward rounded shoulders as expected post op  UPPER EXTREMITY ROM:   Passive ROM Left 01/02/2022  Shoulder flexion 150 per visual inspection   Shoulder extension   Shoulder abduction   Shoulder adduction   Shoulder internal rotation   Shoulder external rotation 45   (Blank rows = not  tested)  UPPER EXTREMITY MMT: 4/10 pt was very guarded in motion, was able to passively flex to 45 per visual inspection  MMT Right    Shoulder flexion    Shoulder extension    Shoulder abduction    Shoulder adduction    Shoulder internal rotation    Shoulder external rotation    Middle trapezius    Lower trapezius    Elbow flexion    Elbow extension    Wrist flexion    Wrist extension    Wrist ulnar deviation    Wrist radial deviation    Wrist pronation    Wrist supination    Grip strength (lbs)    (Blank rows = not tested)     TODAY'S TREATMENT:   6/5 MANUAL: Rt levator scap/upper trap  SL ER to abd punch- PT assisted to avoid roll back SL horiz abd 1lb, tactile cues for scap retraction and depression Prone scap retraction + shoulder extension 1lb  Abd to 90 with mini circles External rotation red tband  6/2 Ube no resistance 2 min forward and 2 min back   PROM in flexion and ER with grade II/III AP mobs  Supine ABC 2x 2lb weight  Supine flexion 2x10 2lb Side lying ER 3x10 2 lb   Scap star 2x8 each direction, red band  Scap retraction cable 2x10 20 lbs  Shoulder extension cable 2x10 20 lbs  5/31  PROM: gentle PROM into all planes. Grade II and III posterior and inferior glides  AROM flexion supine x12  ABC 2x 2lb weight   Prone Row x20 4 lbs  Prone extension x20 4lbs  Supine flexion x15 1lb  Side lying ER 3x10 1 lb   Pulleys 2 min  flexion  Pulleys 1 min scaption    Scap retraction cable 2x15 20 lbs   Shoulder extension cable 2x15 20 lbs Standing flexion 2x10   Standing scaption 2x 10 both in the mirror without pain     PATIENT EDUCATION: Education details: Teacher, music of condition, POC, HEP, exercise form/rationale  Person educated: Patient Education method: Explanation, Demonstration, Tactile cues, Verbal cues, and Handouts Education comprehension: verbalized understanding, returned demonstration, verbal cues required, tactile cues required, and needs further education   HOME EXERCISE PROGRAM: [id number]  ASSESSMENT:  CLINICAL IMPRESSION: Focused on control of scapula in large range motions and ended with resisted ER to challenge in a fatigued state.   OBJECTIVE IMPAIRMENTS decreased activity tolerance, decreased ROM, decreased strength, increased muscle spasms, impaired flexibility, impaired UE functional use, improper body mechanics, postural dysfunction, and pain.   ACTIVITY LIMITATIONS cleaning, community activity, driving, meal prep, and yard work.   PERSONAL FACTORS 1-2 comorbidities: asthma, bilateral shoulder pain  are also affecting patient's functional outcome.    REHAB POTENTIAL: Good  CLINICAL DECISION MAKING: Stable/uncomplicated  EVALUATION COMPLEXITY: Low   GOALS: Goals reviewed with patient? Yes  SHORT TERM GOALS: Target date: 12/12/2021  Passive flexion to 120 Baseline: Goal status: achieved  2.  Proper activation of scapular retraction Baseline:  Goal status: achieved   LONG TERM GOALS: Target date: 02/06/2022  Able to maneuver at least 5lb through full ROM  Baseline:  Goal status: INITIAL  2.  GHJ strength to 75% of opp UE via tindeq/dynamometry testing Baseline:  Goal status: INITIAL  3.  Pt will meet FOTO goal Baseline:  Goal status: INITIAL  4.  Further goals to be set regarding return to sport at this time   PLAN: PT FREQUENCY: 1-2x/week  PT  DURATION:  12 weeks  PLANNED INTERVENTIONS: Therapeutic exercises, Therapeutic activity, Neuromuscular re-education, Patient/Family education, Joint mobilization, Aquatic Therapy, Dry Needling, Electrical stimulation, Spinal mobilization, Cryotherapy, Moist heat, Taping, Traction, and Manual therapy  PLAN FOR NEXT SESSION: Continue AROM, shoulder strengthening, scap stability,   Justino Boze C. Sila Sarsfield PT, DPT 01/08/22 3:59 PM

## 2022-01-10 ENCOUNTER — Encounter (HOSPITAL_BASED_OUTPATIENT_CLINIC_OR_DEPARTMENT_OTHER): Payer: Self-pay | Admitting: Physical Therapy

## 2022-01-10 ENCOUNTER — Ambulatory Visit (HOSPITAL_BASED_OUTPATIENT_CLINIC_OR_DEPARTMENT_OTHER): Payer: No Typology Code available for payment source | Admitting: Physical Therapy

## 2022-01-10 DIAGNOSIS — M25511 Pain in right shoulder: Secondary | ICD-10-CM

## 2022-01-10 DIAGNOSIS — M6281 Muscle weakness (generalized): Secondary | ICD-10-CM

## 2022-01-10 DIAGNOSIS — M25611 Stiffness of right shoulder, not elsewhere classified: Secondary | ICD-10-CM

## 2022-01-10 NOTE — Therapy (Incomplete)
OUTPATIENT PHYSICAL THERAPY SHOULDER Treatment   Patient Name: Timothy Griffith MRN: [id number] DOB:Dec 30, 1995, 26 y.o., male Today's Date: 01/10/2022    Past Medical History:  Diagnosis Date   Asthma    COVID-25 Aug 2019 and Jan 2022   Environmental allergies    Past Surgical History:  Procedure Laterality Date   SHOULDER ARTHROSCOPY WITH LABRAL REPAIR Right 11/07/2021   Procedure: RIGHT SHOULDER ARTHROSCOPY WITH LABRAL REPAIR;  Surgeon: [name], MD;  Location: [facility];  Service: Orthopedics;  Laterality: Right;   Patient Active Problem List   Diagnosis Date Noted   Right shoulder pain 09/25/2021   Exercise-induced asthma 06/03/2012   Scoliosis 06/03/2012   ADD (attention deficit disorder with hyperactivity) 09/16/2011    PCP: Ardith Dark, MD  REFERRING PROVIDER:Dr [name]   REFERRING DIAG: Rt labral repair on 4/4  THERAPY DIAG:  No diagnosis found.   ONSET DATE: initial injury in 2017; Rt labral repair on 4/4  SUBJECTIVE:                                                                                                                                                                                      SUBJECTIVE STATEMENT: Patient reports shoulder is still feeling good with no c/o pain, limited by fatigue with activity.  PERTINENT HISTORY: asthma  PAIN:  Are you having pain? No  PRECAUTIONS: Other: labral repair  WEIGHT BEARING RESTRICTIONS Yes shoulder  FALLS:  Has patient fallen in last 6 months? No  LIVING ENVIRONMENT: Lives with: lives with their family  OCCUPATION: Not working right now  PLOF: Independent  PATIENT GOALS playing sports  OBJECTIVE:    PATIENT SURVEYS:  FOTO 22    POSTURE: Forward rounded shoulders as expected post op  UPPER EXTREMITY ROM:   Passive ROM Left 01/02/2022  Shoulder flexion 150 per visual inspection   Shoulder extension   Shoulder abduction   Shoulder adduction   Shoulder internal  rotation   Shoulder external rotation 45   (Blank rows = not tested)  UPPER EXTREMITY MMT: 4/10 pt was very guarded in motion, was able to passively flex to 45 per visual inspection  MMT Right    Shoulder flexion    Shoulder extension    Shoulder abduction    Shoulder adduction    Shoulder internal rotation    Shoulder external rotation    Middle trapezius    Lower trapezius    Elbow flexion    Elbow extension    Wrist flexion    Wrist extension    Wrist ulnar deviation    Wrist radial deviation    Wrist pronation  Wrist supination    Grip strength (lbs)    (Blank rows = not tested)     TODAY'S TREATMENT:   6/7 Warmup: UBE forward, 2 min backward Manual - PROM flexion, ER with grade III/IV mobilization Sidelying ER, 3x10 3lb Cable row, 3x10, 25lb  Paloff press cables - 2x12, 10lb Chop cables 2x12, 5lb  Standing flexion, scaption with mirror, x10 each 3lb  Scap star, 2x8 red band  6/5 MANUAL: Rt levator scap/upper trap  SL ER to abd punch- PT assisted to avoid roll back SL horiz abd 1lb, tactile cues for scap retraction and depression Prone scap retraction + shoulder extension 1lb  Abd to 90 with mini circles External rotation red tband  6/2 Ube no resistance 2 min forward and 2 min back   PROM in flexion and ER with grade II/III AP mobs  Supine ABC 2x 2lb weight  Supine flexion 2x10 2lb Side lying ER 3x10 2 lb   Scap star 2x8 each direction, red band  Scap retraction cable 2x10 20 lbs  Shoulder extension cable 2x10 20 lbs  5/31  PROM: gentle PROM into all planes. Grade II and III posterior and inferior glides  AROM flexion supine x12  ABC 2x 2lb weight   Prone Row x20 4 lbs  Prone extension x20 4lbs  Supine flexion x15 1lb  Side lying ER 3x10 1 lb   Pulleys 2 min flexion  Pulleys 1 min scaption    Scap retraction cable 2x15 20 lbs   Shoulder extension cable 2x15 20 lbs Standing flexion 2x10   Standing scaption 2x 10 both  in the mirror without pain     PATIENT EDUCATION: Education details: Teacher, music of condition, POC, HEP, exercise form/rationale  Person educated: Patient Education method: Explanation, Demonstration, Tactile cues, Verbal cues, and Handouts Education comprehension: verbalized understanding, returned demonstration, verbal cues required, tactile cues required, and needs further education   HOME EXERCISE PROGRAM: [id number]  ASSESSMENT:  CLINICAL IMPRESSION: Focused on control of scapula in large range motions and ended with resisted ER to challenge in a fatigued state.   OBJECTIVE IMPAIRMENTS decreased activity tolerance, decreased ROM, decreased strength, increased muscle spasms, impaired flexibility, impaired UE functional use, improper body mechanics, postural dysfunction, and pain.   ACTIVITY LIMITATIONS cleaning, community activity, driving, meal prep, and yard work.   PERSONAL FACTORS 1-2 comorbidities: asthma, bilateral shoulder pain  are also affecting patient's functional outcome.    REHAB POTENTIAL: Good  CLINICAL DECISION MAKING: Stable/uncomplicated  EVALUATION COMPLEXITY: Low   GOALS: Goals reviewed with patient? Yes  SHORT TERM GOALS: Target date: 12/12/2021  Passive flexion to 120 Baseline: Goal status: achieved  2.  Proper activation of scapular retraction Baseline:  Goal status: achieved   LONG TERM GOALS: Target date: 02/06/2022  Able to maneuver at least 5lb through full ROM  Baseline:  Goal status: INITIAL  2.  GHJ strength to 75% of opp UE via tindeq/dynamometry testing Baseline:  Goal status: INITIAL  3.  Pt will meet FOTO goal Baseline:  Goal status: INITIAL  4.  Further goals to be set regarding return to sport at this time   PLAN: PT FREQUENCY: 1-2x/week  PT DURATION: 12 weeks  PLANNED INTERVENTIONS: Therapeutic exercises, Therapeutic activity, Neuromuscular re-education, Patient/Family education, Joint mobilization, Aquatic Therapy,  Dry Needling, Electrical stimulation, Spinal mobilization, Cryotherapy, Moist heat, Taping, Traction, and Manual therapy  PLAN FOR NEXT SESSION: Continue AROM, shoulder strengthening, scap stability,   Jessica C. Hightower PT, DPT 01/10/22 2:33 PM

## 2022-01-11 ENCOUNTER — Encounter (HOSPITAL_BASED_OUTPATIENT_CLINIC_OR_DEPARTMENT_OTHER): Payer: Self-pay | Admitting: Physical Therapy

## 2022-01-11 NOTE — Therapy (Signed)
OUTPATIENT PHYSICAL THERAPY SHOULDER Treatment   Patient Name: Timothy Griffith MRN: [id number] DOB:[birth year], 26 y.o., male Today's Date: 01/10/2022   PT End of Session - 01/10/22 1446     Visit Number 14    Number of Visits 25    Date for PT Re-Evaluation 02/09/22    Authorization Type [employer]    PT Start Time 1435    PT Stop Time 1513    PT Time Calculation (min) 38 min    Activity Tolerance Patient tolerated treatment well    Behavior During Therapy WFL for tasks assessed/performed                PCP: Vivi Barrack, MD   REFERRING PROVIDER:Dr Vanetta Mulders    REFERRING DIAG: Rt labral repair on 4/4   THERAPY DIAG:  No diagnosis found.     ONSET DATE: initial injury in 2017; Rt labral repair on 4/4   SUBJECTIVE:                                                                                                                                                                                       SUBJECTIVE STATEMENT: Patient reports shoulder continues to feel good, no pain. Activity tolerance and fatigue following sessions have been improving.   PERTINENT HISTORY: asthma   PAIN:  Are you having pain? No   PRECAUTIONS: Other: labral repair   WEIGHT BEARING RESTRICTIONS Yes shoulder   FALLS:  Has patient fallen in last 6 months? No   LIVING ENVIRONMENT: Lives with: lives with their family   OCCUPATION: Not working right now   PLOF: Independent   PATIENT GOALS playing sports   OBJECTIVE:      PATIENT SURVEYS:  FOTO 22       POSTURE: Forward rounded shoulders as expected post op   UPPER EXTREMITY ROM:    Passive ROM Left 01/02/2022  Shoulder flexion 150 per visual inspection   Shoulder extension    Shoulder abduction    Shoulder adduction    Shoulder internal rotation    Shoulder external rotation 45    (Blank rows = not tested)   UPPER EXTREMITY MMT: 4/10 pt was very guarded in motion, was able to passively flex to 45 per visual  inspection   MMT Right      Shoulder flexion      Shoulder extension      Shoulder abduction      Shoulder adduction      Shoulder internal rotation      Shoulder external rotation      Middle trapezius      Lower trapezius  Elbow flexion      Elbow extension      Wrist flexion      Wrist extension      Wrist ulnar deviation      Wrist radial deviation      Wrist pronation      Wrist supination      Grip strength (lbs)      (Blank rows = not tested)                 TODAY'S TREATMENT:    6/7 Warmup: UBE 86min forward, 2 min backward Manual - PROM flexion, ER with grade III/IV mobilization Sidelying ER, 3x10 3lb Cable row, 3x10, 25lb   Paloff press cables - 2x12, 10lb Chop cables 2x12, 5lb   Standing flexion, scaption with mirror, x10 each 3lb   Scap star, 2x8 red band   6/5 MANUAL: Rt levator scap/upper trap   SL ER to abd punch- PT assisted to avoid roll back SL horiz abd 1lb, tactile cues for scap retraction and depression Prone scap retraction + shoulder extension 1lb  Abd to 90 with mini circles External rotation red tband   6/2 Ube no resistance 2 min forward and 2 min back           PROM in flexion and ER with grade II/III AP mobs   Supine ABC 2x 2lb weight  Supine flexion 2x10 2lb Side lying ER 3x10 2 lb    Scap star 2x8 each direction, red band   Scap retraction cable 2x10 20 lbs  Shoulder extension cable 2x10 20 lbs   5/31  PROM: gentle PROM into all planes. Grade II and III posterior and inferior glides   AROM flexion supine x12   ABC 2x 2lb weight    Prone Row x20 4 lbs  Prone extension x20 4lbs  Supine flexion x15 1lb   Side lying ER 3x10 1 lb    Pulleys 2 min flexion  Pulleys 1 min scaption      Scap retraction cable 2x15 20 lbs    Shoulder extension cable 2x15 20 lbs Standing flexion 2x10    Standing scaption 2x 10 both in the mirror without pain        PATIENT EDUCATION: Education details: Geophysicist/field seismologist of condition,  POC, HEP, exercise form/rationale   Person educated: Patient Education method: Explanation, Demonstration, Tactile cues, Verbal cues, and Handouts Education comprehension: verbalized understanding, returned demonstration, verbal cues required, tactile cues required, and needs further education     HOME EXERCISE PROGRAM: [id number]   ASSESSMENT:   CLINICAL IMPRESSION: Pt continues to progress very well, tolerates more dynamic shoulder diagonal movements with no increase in pain. Pt responds well to manual therapy; has near full passive flexion ROM and improved passive ER following; able to further AROM through both as well.   OBJECTIVE IMPAIRMENTS decreased activity tolerance, decreased ROM, decreased strength, increased muscle spasms, impaired flexibility, impaired UE functional use, improper body mechanics, postural dysfunction, and pain.    ACTIVITY LIMITATIONS cleaning, community activity, driving, meal prep, and yard work.    PERSONAL FACTORS 1-2 comorbidities: asthma, bilateral shoulder pain  are also affecting patient's functional outcome.      REHAB POTENTIAL: Good   CLINICAL DECISION MAKING: Stable/uncomplicated   EVALUATION COMPLEXITY: Low     GOALS: Goals reviewed with patient? Yes   SHORT TERM GOALS: Target date: 12/12/2021   Passive flexion to 120 Baseline: Goal status: achieved   2.  Proper activation of scapular retraction Baseline:  Goal status: achieved     LONG TERM GOALS: Target date: 02/06/2022   Able to maneuver at least 5lb through full ROM  Baseline:  Goal status: INITIAL   2.  GHJ strength to 75% of opp UE via tindeq/dynamometry testing Baseline:  Goal status: INITIAL   3.  Pt will meet FOTO goal Baseline:  Goal status: INITIAL   4.  Further goals to be set regarding return to sport at this time     PLAN: PT FREQUENCY: 1-2x/week   PT DURATION: 12 weeks   PLANNED INTERVENTIONS: Therapeutic exercises, Therapeutic activity, Neuromuscular  re-education, Patient/Family education, Joint mobilization, Aquatic Therapy, Dry Needling, Electrical stimulation, Spinal mobilization, Cryotherapy, Moist heat, Taping, Traction, and Manual therapy   PLAN FOR NEXT SESSION: Continue PROM/AROM, progress shoulder strength and scapular stability exercises. Begin discussing gym/exercise program.         Lenell Antu, SPT 01/11/2022 8:40 AM     Past Medical History:  Diagnosis Date   Asthma    COVID-25 Aug 2019 and Jan 2022   Environmental allergies    Past Surgical History:  Procedure Laterality Date   SHOULDER ARTHROSCOPY WITH LABRAL REPAIR Right 11/07/2021   Procedure: RIGHT SHOULDER ARTHROSCOPY WITH LABRAL REPAIR;  Surgeon: Vanetta Mulders, MD;  Location: [place];  Service: Orthopedics;  Laterality: Right;   Patient Active Problem List   Diagnosis Date Noted   Right shoulder pain 09/25/2021   Exercise-induced asthma 06/03/2012   Scoliosis 06/03/2012   ADD (attention deficit disorder with hyperactivity) 09/16/2011   Burnis Medin PT DPT  01/11/22 10:09 AM

## 2022-01-15 ENCOUNTER — Encounter (HOSPITAL_BASED_OUTPATIENT_CLINIC_OR_DEPARTMENT_OTHER): Payer: No Typology Code available for payment source | Admitting: Physical Therapy

## 2022-01-17 ENCOUNTER — Encounter (HOSPITAL_BASED_OUTPATIENT_CLINIC_OR_DEPARTMENT_OTHER): Payer: No Typology Code available for payment source | Admitting: Physical Therapy

## 2022-01-22 ENCOUNTER — Ambulatory Visit (HOSPITAL_BASED_OUTPATIENT_CLINIC_OR_DEPARTMENT_OTHER): Payer: No Typology Code available for payment source | Admitting: Physical Therapy

## 2022-01-22 DIAGNOSIS — M6281 Muscle weakness (generalized): Secondary | ICD-10-CM

## 2022-01-22 DIAGNOSIS — M25511 Pain in right shoulder: Secondary | ICD-10-CM | POA: Diagnosis not present

## 2022-01-22 DIAGNOSIS — M25611 Stiffness of right shoulder, not elsewhere classified: Secondary | ICD-10-CM

## 2022-01-22 NOTE — Therapy (Signed)
OUTPATIENT PHYSICAL THERAPY SHOULDER Treatment   Patient Name: Timothy Griffith MRN: [id number] DOB:25-Oct-1995, 26 y.o., male Today's Date: 01/10/2022        PCP: Ardith Dark, MD   REFERRING PROVIDER:Dr [name]    REFERRING DIAG: Rt labral repair on 4/4   THERAPY DIAG:  No diagnosis found.     ONSET DATE: initial injury in 2017; Rt labral repair on 4/4   SUBJECTIVE:                                                                                                                                                                                       SUBJECTIVE STATEMENT: Patient reports shoulder continues to feel good, no pain. Activity tolerance and fatigue following sessions have been improving.   PERTINENT HISTORY: asthma   PAIN:  Are you having pain? No   PRECAUTIONS: Other: labral repair   WEIGHT BEARING RESTRICTIONS Yes shoulder   FALLS:  Has patient fallen in last 6 months? No   LIVING ENVIRONMENT: Lives with: lives with their family   OCCUPATION: Not working right now   PLOF: Independent   PATIENT GOALS playing sports   OBJECTIVE:      PATIENT SURVEYS:  FOTO 22       POSTURE: Forward rounded shoulders as expected post op   UPPER EXTREMITY ROM:    Passive ROM Left 01/02/2022  Shoulder flexion 150 per visual inspection   Shoulder extension    Shoulder abduction    Shoulder adduction    Shoulder internal rotation    Shoulder external rotation 45    (Blank rows = not tested)   UPPER EXTREMITY MMT: 4/10 pt was very guarded in motion, was able to passively flex to 45 per visual inspection   MMT Right      Shoulder flexion      Shoulder extension      Shoulder abduction      Shoulder adduction      Shoulder internal rotation      Shoulder external rotation      Middle trapezius      Lower trapezius      Elbow flexion      Elbow extension      Wrist flexion      Wrist extension      Wrist ulnar deviation      Wrist radial  deviation      Wrist pronation      Wrist supination      Grip strength (lbs)      (Blank rows = not tested)  TODAY'S TREATMENT:    6/7 Warmup: UBE forward, 2 min backward Manual - PROM flexion, ER with grade III/IV mobilization Sidelying ER, 3x10 3lb Cable row, 3x10, 25lb   Paloff press cables - 2x12, 10lb Chop cables 2x12, 5lb   Standing flexion, scaption with mirror, x10 each 3lb   Scap star, 2x8 red band   6/5 MANUAL: Rt levator scap/upper trap   SL ER to abd punch- PT assisted to avoid roll back SL horiz abd 1lb, tactile cues for scap retraction and depression Prone scap retraction + shoulder extension 1lb  Abd to 90 with mini circles External rotation red tband   6/2 Ube no resistance 2 min forward and 2 min back           PROM in flexion and ER with grade II/III AP mobs   Supine ABC 2x 2lb weight  Supine flexion 2x10 2lb Side lying ER 3x10 2 lb    Scap star 2x8 each direction, red band   Scap retraction cable 2x10 20 lbs  Shoulder extension cable 2x10 20 lbs   5/31  PROM: gentle PROM into all planes. Grade II and III posterior and inferior glides   AROM flexion supine x12   ABC 2x 2lb weight    Prone Row x20 4 lbs  Prone extension x20 4lbs  Supine flexion x15 1lb   Side lying ER 3x10 1 lb    Pulleys 2 min flexion  Pulleys 1 min scaption      Scap retraction cable 2x15 20 lbs    Shoulder extension cable 2x15 20 lbs Standing flexion 2x10    Standing scaption 2x 10 both in the mirror without pain        PATIENT EDUCATION: Education details: Teacher, music of condition, POC, HEP, exercise form/rationale   Person educated: Patient Education method: Explanation, Demonstration, Tactile cues, Verbal cues, and Handouts Education comprehension: verbalized understanding, returned demonstration, verbal cues required, tactile cues required, and needs further education     HOME EXERCISE PROGRAM: [id number]   ASSESSMENT:    CLINICAL IMPRESSION: Pt continues to progress very well, tolerates more dynamic shoulder diagonal movements with no increase in pain. Pt responds well to manual therapy; has near full passive flexion ROM and improved passive ER following; able to further AROM through both as well.   OBJECTIVE IMPAIRMENTS decreased activity tolerance, decreased ROM, decreased strength, increased muscle spasms, impaired flexibility, impaired UE functional use, improper body mechanics, postural dysfunction, and pain.    ACTIVITY LIMITATIONS cleaning, community activity, driving, meal prep, and yard work.    PERSONAL FACTORS 1-2 comorbidities: asthma, bilateral shoulder pain  are also affecting patient's functional outcome.      REHAB POTENTIAL: Good   CLINICAL DECISION MAKING: Stable/uncomplicated   EVALUATION COMPLEXITY: Low     GOALS: Goals reviewed with patient? Yes   SHORT TERM GOALS: Target date: 12/12/2021   Passive flexion to 120 Baseline: Goal status: achieved   2.  Proper activation of scapular retraction Baseline:  Goal status: achieved     LONG TERM GOALS: Target date: 02/06/2022   Able to maneuver at least 5lb through full ROM  Baseline:  Goal status: INITIAL   2.  GHJ strength to 75% of opp UE via tindeq/dynamometry testing Baseline:  Goal status: INITIAL   3.  Pt will meet FOTO goal Baseline:  Goal status: INITIAL   4.  Further goals to be set regarding return to sport at this time     PLAN: PT FREQUENCY: 1-2x/week  PT DURATION: 12 weeks   PLANNED INTERVENTIONS: Therapeutic exercises, Therapeutic activity, Neuromuscular re-education, Patient/Family education, Joint mobilization, Aquatic Therapy, Dry Needling, Electrical stimulation, Spinal mobilization, Cryotherapy, Moist heat, Taping, Traction, and Manual therapy   PLAN FOR NEXT SESSION: Continue PROM/AROM, progress shoulder strength and scapular stability exercises. Begin discussing gym/exercise program.          Loura Halt, SPT 01/11/2022 8:40 AM     Past Medical History:  Diagnosis Date   Asthma    COVID-25 Aug 2019 and Jan 2022   Environmental allergies    Past Surgical History:  Procedure Laterality Date   SHOULDER ARTHROSCOPY WITH LABRAL REPAIR Right 11/07/2021   Procedure: RIGHT SHOULDER ARTHROSCOPY WITH LABRAL REPAIR;  Surgeon: [name], MD;  Location: [facility];  Service: Orthopedics;  Laterality: Right;   Patient Active Problem List   Diagnosis Date Noted   Right shoulder pain 09/25/2021   Exercise-induced asthma 06/03/2012   Scoliosis 06/03/2012   ADD (attention deficit disorder with hyperactivity) 09/16/2011   Illene Labrador PT DPT  01/22/22 2:37 PM

## 2022-01-23 ENCOUNTER — Encounter (HOSPITAL_BASED_OUTPATIENT_CLINIC_OR_DEPARTMENT_OTHER): Payer: Self-pay | Admitting: Physical Therapy

## 2022-01-24 ENCOUNTER — Encounter (HOSPITAL_BASED_OUTPATIENT_CLINIC_OR_DEPARTMENT_OTHER): Payer: Self-pay | Admitting: Physical Therapy

## 2022-01-24 ENCOUNTER — Ambulatory Visit (HOSPITAL_BASED_OUTPATIENT_CLINIC_OR_DEPARTMENT_OTHER): Payer: No Typology Code available for payment source | Admitting: Physical Therapy

## 2022-01-24 DIAGNOSIS — M25511 Pain in right shoulder: Secondary | ICD-10-CM | POA: Diagnosis not present

## 2022-01-24 DIAGNOSIS — M25611 Stiffness of right shoulder, not elsewhere classified: Secondary | ICD-10-CM

## 2022-01-24 DIAGNOSIS — M6281 Muscle weakness (generalized): Secondary | ICD-10-CM

## 2022-01-24 NOTE — Therapy (Incomplete)
OUTPATIENT PHYSICAL THERAPY SHOULDER Treatment   Patient Name: Timothy Griffith MRN: [id number] DOB:14-Feb-1996, 26 y.o., male Today's Date: 01/10/2022         PCP: Ardith Dark, MD   REFERRING PROVIDER:Dr [name]    REFERRING DIAG: Rt labral repair on 4/4   THERAPY DIAG:  No diagnosis found.     ONSET DATE: initial injury in 2017; Rt labral repair on 4/4   SUBJECTIVE:                                                                                                                                                                                       SUBJECTIVE STATEMENT: Patient reports shoulder continues to feel good, no pain and mild fatigue following session previous. Activity tolerance and fatigue following sessions have been improving.   PERTINENT HISTORY: asthma   PAIN:  Are you having pain? No   PRECAUTIONS: Other: labral repair   WEIGHT BEARING RESTRICTIONS Yes shoulder   FALLS:  Has patient fallen in last 6 months? No   LIVING ENVIRONMENT: Lives with: lives with their family   OCCUPATION: Not working right now   PLOF: Independent   PATIENT GOALS playing sports   OBJECTIVE:      PATIENT SURVEYS:  FOTO 22       POSTURE: Forward rounded shoulders as expected post op   UPPER EXTREMITY ROM:    Passive ROM Left 01/02/2022  Shoulder flexion 150 per visual inspection   Shoulder extension    Shoulder abduction    Shoulder adduction    Shoulder internal rotation    Shoulder external rotation 45    (Blank rows = not tested)   UPPER EXTREMITY MMT: 4/10 pt was very guarded in motion, was able to passively flex to 45 per visual inspection   MMT Right      Shoulder flexion      Shoulder extension      Shoulder abduction      Shoulder adduction      Shoulder internal rotation      Shoulder external rotation      Middle trapezius      Lower trapezius      Elbow flexion      Elbow extension      Wrist flexion      Wrist extension       Wrist ulnar deviation      Wrist radial deviation      Wrist pronation      Wrist supination      Grip strength (lbs)      (Blank rows = not tested)  TODAY'S TREATMENT:    6/21 UBE Warmup - L1  PROM flexion, ER by side and 90deg abd, with grade III/IV AP/inf mobs Supine flexion 2x10 4lb Side lying ER 2x10 4 lb   90/90 ER/IR 2x15 yellow band  Shoulder abd to 90, 2x15 red band Standing flexion to 90, 2x15 red band Bil shoulder ER at side, 1x15 red band      6/19 Pulley 2 min  PROM in flexion and ER with grade II/III AP mobs   Supine ABC 2x 4lb weight  Supine flexion 2x10 4lb Side lying ER 3x10 4 lb   90/90 ER and IR yellow back 3x15 as close to 90 shoulder flexion as tolerated.  Paloff press blue 2x10 each side   Standing flexion 3lbs to 90 2x15  Standing scaption 3lbs 2x15    6/7 Warmup: UBE forward, 2 min backward Manual - PROM flexion, ER with grade III/IV mobilization Sidelying ER, 3x10 3lb Cable row, 3x10, 25lb   Paloff press cables - 2x12, 10lb Chop cables 2x12, 5lb   Standing flexion, scaption with mirror, x10 each 3lb   Scap star, 2x8 red band     PATIENT EDUCATION: Education details: Anatomy of condition, POC, HEP, exercise form/rationale   Person educated: Patient Education method: Explanation, Demonstration, Tactile cues, Verbal cues, and Handouts Education comprehension: verbalized understanding, returned demonstration, verbal cues required, tactile cues required, and needs further education     HOME EXERCISE PROGRAM: [id number]   ASSESSMENT:   CLINICAL IMPRESSION: Pt is continuing progression of range and strength - flexion and scaption AROM are full. ER PROM supine is near full, approx 80deg; he is able to use 75% in AROM sidelying. No increase in pain today, some fatigue following. Pt will continue to benefit from skilled therapy to advance end range of motion, strength, and function.   OBJECTIVE  IMPAIRMENTS decreased activity tolerance, decreased ROM, decreased strength, increased muscle spasms, impaired flexibility, impaired UE functional use, improper body mechanics, postural dysfunction, and pain.    ACTIVITY LIMITATIONS cleaning, community activity, driving, meal prep, and yard work.    PERSONAL FACTORS 1-2 comorbidities: asthma, bilateral shoulder pain  are also affecting patient's functional outcome.      REHAB POTENTIAL: Good   CLINICAL DECISION MAKING: Stable/uncomplicated   EVALUATION COMPLEXITY: Low     GOALS: Goals reviewed with patient? Yes   SHORT TERM GOALS: Target date: 12/12/2021   Passive flexion to 120 Baseline: Goal status: achieved   2.  Proper activation of scapular retraction Baseline:  Goal status: achieved     LONG TERM GOALS: Target date: 02/06/2022   Able to maneuver at least 5lb through full ROM  Baseline:  Goal status: INITIAL   2.  GHJ strength to 75% of opp UE via tindeq/dynamometry testing Baseline:  Goal status: INITIAL   3.  Pt will meet FOTO goal Baseline:  Goal status: INITIAL   4.  Further goals to be set regarding return to sport at this time     PLAN: PT FREQUENCY: 1-2x/week   PT DURATION: 12 weeks   PLANNED INTERVENTIONS: Therapeutic exercises, Therapeutic activity, Neuromuscular re-education, Patient/Family education, Joint mobilization, Aquatic Therapy, Dry Needling, Electrical stimulation, Spinal mobilization, Cryotherapy, Moist heat, Taping, Traction, and Manual therapy   PLAN FOR NEXT SESSION: Continue PROM/AROM, progress shoulder strength and scapular stability exercises. Begin discussing gym/exercise program.         Loura Halt, SPT 01/24/2022 3:12 PM  During this treatment session, the therapist was present, participating in  and directing the treatment.  Illene Labrador PT DPT  01/24/22 2:39 PM

## 2022-01-25 NOTE — Therapy (Signed)
OUTPATIENT PHYSICAL THERAPY SHOULDER TREATMENT Patient Name: Timothy Griffith MRN: [id number] DOB:Dec 30, 1995, 26 y.o., male Today's Date: 01/10/2022   PT End of Session - 01/24/22 1504     Visit Number 16    Number of Visits 25    Date for PT Re-Evaluation 02/09/22    Authorization Type [employer]    PT Start Time 1437    PT Stop Time 1514    PT Time Calculation (min) 37 min    Activity Tolerance Patient tolerated treatment well    Behavior During Therapy WFL for tasks assessed/performed                  PCP: Ardith Dark, MD   REFERRING PROVIDER:Dr [name]    REFERRING DIAG: Rt labral repair on 4/4   THERAPY DIAG:  No diagnosis found.     ONSET DATE: initial injury in 2017; Rt labral repair on 4/4   SUBJECTIVE:                                                                                                                                                                                       SUBJECTIVE STATEMENT: Patient says shoulder still feels good. Has noticed some increase in fatigue with sessions, attributed to increasing strength and available range.    PERTINENT HISTORY: asthma   PAIN:  Are you having pain? No   PRECAUTIONS: Other: labral repair   WEIGHT BEARING RESTRICTIONS Yes shoulder   FALLS:  Has patient fallen in last 6 months? No   LIVING ENVIRONMENT: Lives with: lives with their family   OCCUPATION: Not working right now   PLOF: Independent   PATIENT GOALS playing sports   OBJECTIVE:      PATIENT SURVEYS:  FOTO 22       POSTURE: Forward rounded shoulders as expected post op   UPPER EXTREMITY ROM:    Passive ROM Left 01/02/2022  Shoulder flexion 150 per visual inspection   Shoulder extension    Shoulder abduction    Shoulder adduction    Shoulder internal rotation    Shoulder external rotation 45    (Blank rows = not tested)   UPPER EXTREMITY MMT: 4/10 pt was very guarded in motion, was able to passively flex  to 45 per visual inspection   MMT Right      Shoulder flexion      Shoulder extension      Shoulder abduction      Shoulder adduction      Shoulder internal rotation      Shoulder external rotation      Middle trapezius      Lower trapezius  Elbow flexion      Elbow extension      Wrist flexion      Wrist extension      Wrist ulnar deviation      Wrist radial deviation      Wrist pronation      Wrist supination      Grip strength (lbs)      (Blank rows = not tested)                 TODAY'S TREATMENT:    6/21 UBE Warmup - L1  PROM flexion, ER by side and 90deg abd, with grade III/IV AP/inf mobs Supine flexion 2x10 4lb Side lying ER 2x10 4 lb   90/90 ER/IR 2x15 yellow band  Shoulder abd to 90, 2x15 red band Standing flexion to 90, 2x15 red band Bil shoulder ER at side, 1x15 red band      6/19 Pulley 2 min  PROM in flexion and ER with grade II/III AP mobs   Supine ABC 2x 4lb weight  Supine flexion 2x10 4lb Side lying ER 3x10 4 lb   90/90 ER and IR yellow back 3x15 as close to 90 shoulder flexion as tolerated.  Paloff press blue 2x10 each side   Standing flexion 3lbs to 90 2x15  Standing scaption 3lbs 2x15      PATIENT EDUCATION: Education details: Anatomy of condition, POC, HEP, exercise form/rationale   Person educated: Patient Education method: Explanation, Demonstration, Tactile cues, Verbal cues, and Handouts Education comprehension: verbalized understanding, returned demonstration, verbal cues required, tactile cues required, and needs further education     HOME EXERCISE PROGRAM: [id number]   ASSESSMENT:   CLINICAL IMPRESSION: Pt is continuing progression of range and strength - flexion and scaption AROM are full. ER PROM supine is near full, approx 80deg; he is able to use 75% in AROM sidelying. No increase in pain today, some fatigue following. Pt will continue to benefit from skilled therapy to advance end range of motion, strength,  and function.   OBJECTIVE IMPAIRMENTS decreased activity tolerance, decreased ROM, decreased strength, increased muscle spasms, impaired flexibility, impaired UE functional use, improper body mechanics, postural dysfunction, and pain.    ACTIVITY LIMITATIONS cleaning, community activity, driving, meal prep, and yard work.    PERSONAL FACTORS 1-2 comorbidities: asthma, bilateral shoulder pain  are also affecting patient's functional outcome.      REHAB POTENTIAL: Good   CLINICAL DECISION MAKING: Stable/uncomplicated   EVALUATION COMPLEXITY: Low     GOALS: Goals reviewed with patient? Yes   SHORT TERM GOALS: Target date: 12/12/2021   Passive flexion to 120 Baseline: Goal status: achieved   2.  Proper activation of scapular retraction Baseline:  Goal status: achieved     LONG TERM GOALS: Target date: 02/06/2022   Able to maneuver at least 5lb through full ROM  Baseline:  Goal status: INITIAL   2.  GHJ strength to 75% of opp UE via tindeq/dynamometry testing Baseline:  Goal status: INITIAL   3.  Pt will meet FOTO goal Baseline:  Goal status: INITIAL   4.  Further goals to be set regarding return to sport at this time     PLAN: PT FREQUENCY: 1-2x/week   PT DURATION: 12 weeks   PLANNED INTERVENTIONS: Therapeutic exercises, Therapeutic activity, Neuromuscular re-education, Patient/Family education, Joint mobilization, Aquatic Therapy, Dry Needling, Electrical stimulation, Spinal mobilization, Cryotherapy, Moist heat, Taping, Traction, and Manual therapy   PLAN FOR NEXT SESSION: Continue PROM/AROM, progress shoulder strength and scapular  stability exercises. Begin discussing gym/exercise program.         Loura Halt, SPT 01/25/2022 9:14 AM  During this treatment session, the therapist was present, participating in and directing the treatment.  Illene Labrador PT DPT  01/25/22 9:14 AM

## 2022-01-29 ENCOUNTER — Ambulatory Visit (HOSPITAL_BASED_OUTPATIENT_CLINIC_OR_DEPARTMENT_OTHER): Payer: No Typology Code available for payment source | Admitting: Physical Therapy

## 2022-01-29 ENCOUNTER — Encounter (HOSPITAL_BASED_OUTPATIENT_CLINIC_OR_DEPARTMENT_OTHER): Payer: Self-pay | Admitting: Physical Therapy

## 2022-01-29 DIAGNOSIS — M25511 Pain in right shoulder: Secondary | ICD-10-CM

## 2022-01-29 DIAGNOSIS — M6281 Muscle weakness (generalized): Secondary | ICD-10-CM

## 2022-01-29 DIAGNOSIS — M25611 Stiffness of right shoulder, not elsewhere classified: Secondary | ICD-10-CM

## 2022-01-31 ENCOUNTER — Encounter (HOSPITAL_BASED_OUTPATIENT_CLINIC_OR_DEPARTMENT_OTHER): Payer: Self-pay | Admitting: Physical Therapy

## 2022-01-31 ENCOUNTER — Ambulatory Visit (HOSPITAL_BASED_OUTPATIENT_CLINIC_OR_DEPARTMENT_OTHER): Payer: No Typology Code available for payment source | Admitting: Physical Therapy

## 2022-01-31 DIAGNOSIS — M25511 Pain in right shoulder: Secondary | ICD-10-CM | POA: Diagnosis not present

## 2022-01-31 DIAGNOSIS — M6281 Muscle weakness (generalized): Secondary | ICD-10-CM

## 2022-01-31 DIAGNOSIS — M25611 Stiffness of right shoulder, not elsewhere classified: Secondary | ICD-10-CM

## 2022-01-31 NOTE — Therapy (Incomplete)
OUTPATIENT PHYSICAL THERAPY SHOULDER TREATMENT Patient Name: Timothy Griffith MRN: [id number] DOB:Feb 06, 1996, 26 y.o., male Today's Date: 01/10/2022       PCP: Ardith Dark, MD   REFERRING PROVIDER:Dr [name]    REFERRING DIAG: Rt labral repair on 4/4   THERAPY DIAG:  No diagnosis found.     ONSET DATE: initial injury in 2017; Rt labral repair on 4/4   SUBJECTIVE:                                                                                                                                                                                       SUBJECTIVE STATEMENT: Patient feels good today. He went to the court and shot basketball on Monday, says his range was ok but shoulder felt a bit weak. He wants to continue therapy to maximize ROM and strength.     PERTINENT HISTORY: asthma   PAIN:  Are you having pain? No   PRECAUTIONS: Other: labral repair   WEIGHT BEARING RESTRICTIONS Yes shoulder   FALLS:  Has patient fallen in last 6 months? No   LIVING ENVIRONMENT: Lives with: lives with their family   OCCUPATION: Not working right now   PLOF: Independent   PATIENT GOALS  Playing sports   OBJECTIVE:      PATIENT SURVEYS:  FOTO 22    74 - 6/28   POSTURE: Forward rounded shoulders as expected post op   UPPER EXTREMITY ROM:    Passive ROM Left 01/02/2022 Left 01/31/2022  Shoulder flexion 150 per visual inspection  170 (Active)  Shoulder extension     Shoulder abduction   170 (Active)  Shoulder adduction     Shoulder internal rotation     Shoulder external rotation 45   65 (Active)  (Blank rows = not tested)   UPPER EXTREMITY MMT: 4/10 pt was very guarded in motion, was able to passively flex to 45 per visual inspection   MMT Right  6/28  Left 6/28  Shoulder flexion 56.1  54.4   Shoulder extension      Shoulder abduction  44.6  55.6  Shoulder adduction      Shoulder internal rotation 37.1   36.3  Shoulder external rotation  24.7  30.7   Middle trapezius  57.2 57.4  Lower trapezius      Elbow flexion  50.9 47.5   Elbow extension      Wrist flexion      Wrist extension      Wrist ulnar deviation      Wrist radial deviation      Wrist pronation      Wrist supination  Grip strength (lbs)      (Blank rows = not tested)                 TODAY'S TREATMENT:             6/28 UBE Warmup - L1 Performance Measures  Standing shoulder abd - 2x10, green band 90/90 ER/IR - 2x15 red band Serratus punches - 2x10 green band   6/26  UBE Warmup - L1   PROM flexion, ER by side and 90deg abd, with grade III/IV AP/inf mobs Side lying ER 2x10 4 lb  90/90 ER/IR 2x15 yellow band   Shoulder D2 flexion, 2x15 red band Standing flexion, 2x15 green band             6/21 UBE Warmup - L1   PROM flexion, ER by side and 90deg abd, with grade III/IV AP/inf mobs Supine flexion 2x10 4lb Side lying ER 2x10 4 lb    90/90 ER/IR 2x15 yellow band   Shoulder abd to 90, 2x15 red band Standing flexion to 90, 2x15 red band Bil shoulder ER at side, 1x15 red band     PATIENT EDUCATION: Education details: HEP, exercise form   Person educated: Patient Education method: Explanation, Demonstration, Tactile cues, Verbal cues, and Handouts Education comprehension: verbalized understanding, returned demonstration, verbal cues required, tactile cues required, and needs further education     HOME EXERCISE PROGRAM: [id number]   ASSESSMENT:   CLINICAL IMPRESSION: Pt displays excellent improvements with shoulder strength. All motions are equal to or within 10% of contralateral side except abd and ER. Rotator cuff strength will continue to be a priority. He tolerates exercises well today; great form with serratus punches with tactile cueing. He has some mild fatigue at end of session. Patient will continue to benefit from skilled therapy to maximize ROM, strength, and function.   OBJECTIVE IMPAIRMENTS decreased activity  tolerance, decreased ROM, decreased strength, increased muscle spasms, impaired flexibility, impaired UE functional use, improper body mechanics, postural dysfunction, and pain.    ACTIVITY LIMITATIONS cleaning, community activity, driving, meal prep, and yard work.    PERSONAL FACTORS 1-2 comorbidities: asthma, bilateral shoulder pain  are also affecting patient's functional outcome.      REHAB POTENTIAL: Good   CLINICAL DECISION MAKING: Stable/uncomplicated   EVALUATION COMPLEXITY: Low     GOALS: Goals reviewed with patient? Yes   SHORT TERM GOALS: Target date: 12/12/2021   Passive flexion to 120 Baseline: Goal status: achieved   2.  Proper activation of scapular retraction Baseline:  Goal status: achieved     LONG TERM GOALS: Target date: 02/06/2022   Able to maneuver at least 5lb through full ROM  Baseline:  Goal status: INITIAL   2.  GHJ strength to 75% of opp UE via tindeq/dynamometry testing Baseline:  Goal status: Achieved - 6/29   3.  Pt will meet FOTO goal Baseline:  Goal status: Achieved - 6/29   4.  Further goals to be set regarding return to sport at this time     PLAN: PT FREQUENCY: 1-2x/week   PT DURATION: 12 weeks   PLANNED INTERVENTIONS: Therapeutic exercises, Therapeutic activity, Neuromuscular re-education, Patient/Family education, Joint mobilization, Aquatic Therapy, Dry Needling, Electrical stimulation, Spinal mobilization, Cryotherapy, Moist heat, Taping, Traction, and Manual therapy   PLAN FOR NEXT SESSION: Continue AROM exercise, progress shoulder strength with ER/Abd focus. Scap stability in higher planes - incorporate serratus strength. Continue ER/IR at 90abd.  Loura Halt, SPT 01/29/2022 2:32 PM   During this treatment session, the therapist was present, participating in and directing the treatment.   Illene Labrador PT DPT  01/29/22 2:32 PM

## 2022-02-01 ENCOUNTER — Encounter (HOSPITAL_BASED_OUTPATIENT_CLINIC_OR_DEPARTMENT_OTHER): Payer: Self-pay | Admitting: Physical Therapy

## 2022-02-01 NOTE — Therapy (Addendum)
OUTPATIENT PHYSICAL THERAPY SHOULDER TREATMENT/discharge Patient Name: Timothy Griffith MRN: [id number] DOB:1996-06-02, 26 y.o., male Today's Date: 01/10/2022   PT End of Session - 01/31/22 1513     Visit Number 18    Number of Visits 24    Date for PT Re-Evaluation 03/14/22    Authorization Type [employer]    PT Start Time 1435    PT Stop Time 1514    PT Time Calculation (min) 39 min    Activity Tolerance Patient tolerated treatment well    Behavior During Therapy WFL for tasks assessed/performed                PCP: Vivi Barrack, MD   REFERRING PROVIDER:Dr Vanetta Mulders    REFERRING DIAG: Rt labral repair on 4/4   THERAPY DIAG:  No diagnosis found.     ONSET DATE: initial injury in 2017; Rt labral repair on 4/4   SUBJECTIVE:                                                                                                                                                                                       SUBJECTIVE STATEMENT: Patient feels good today. He went to the court and shot basketball on Monday, says his range was ok but shoulder felt a bit weak. He wants to continue therapy to maximize ROM and strength.     PERTINENT HISTORY: asthma   PAIN:  Are you having pain? No   PRECAUTIONS: Other: labral repair   WEIGHT BEARING RESTRICTIONS Yes shoulder   FALLS:  Has patient fallen in last 6 months? No   LIVING ENVIRONMENT: Lives with: lives with their family   OCCUPATION: Not working right now   PLOF: Independent   PATIENT GOALS  Playing sports   OBJECTIVE:      PATIENT SURVEYS:  FOTO 22     74 - 6/28   POSTURE: Forward rounded shoulders as expected post op   UPPER EXTREMITY ROM:    Passive ROM Left 01/02/2022 Left 01/31/2022  Shoulder flexion 150 per visual inspection  170 (Active)  Shoulder extension      Shoulder abduction   170 (Active)  Shoulder adduction      Shoulder internal rotation      Shoulder external rotation 45   65  (Active)  (Blank rows = not tested)   UPPER EXTREMITY MMT: 4/10 pt was very guarded in motion, was able to passively flex to 45 per visual inspection   MMT Right  6/28  Left 6/28  Shoulder flexion 56.1  54.4   Shoulder extension      Shoulder abduction  44.6  55.6  Shoulder adduction      Shoulder internal rotation 37.1   36.3  Shoulder external rotation  24.7  30.7  Middle trapezius  57.2 57.4  Lower trapezius      Elbow flexion  50.9 47.5   Elbow extension      Wrist flexion      Wrist extension      Wrist ulnar deviation      Wrist radial deviation      Wrist pronation      Wrist supination      Grip strength (lbs)      (Blank rows = not tested)                 TODAY'S TREATMENT:             6/28 UBE Warmup - 174mn L1 Performance Measures   Standing shoulder abd - 2x10, green band 90/90 ER/IR - 2x15 red band Serratus punches - 2x10 green band    6/26  UBE Warmup - 520m L1   PROM flexion, ER by side and 90deg abd, with grade III/IV AP/inf mobs Side lying ER 2x10 4 lb  90/90 ER/IR 2x15 yellow band   Shoulder D2 flexion, 2x15 red band Standing flexion, 2x15 green band   Reviewed goals and measurements; reviewed POC going forward..            6/21 UBE Warmup - 74m83mL1   PROM flexion, ER by side and 90deg abd, with grade III/IV AP/inf mobs Supine flexion 2x10 4lb Side lying ER 2x10 4 lb    90/90 ER/IR 2x15 yellow band   Shoulder abd to 90, 2x15 red band Standing flexion to 90, 2x15 red band Bil shoulder ER at side, 1x15 red band     PATIENT EDUCATION: Education details: POC going forward    Person educated: Patient Education method: Explanation, Demonstration, Tactile cues, Verbal cues, and Handouts Education comprehension: verbalized understanding, returned demonstration, verbal cues required, tactile cues required, and needs further education     HOME EXERCISE PROGRAM: [id number]   ASSESSMENT:   CLINICAL IMPRESSION: Pt displays excellent  improvements with shoulder strength. All motions are equal to or within 10% of contralateral side except abd and ER. Rotator cuff strength will continue to be a priority. He tolerates exercises well today; great form with serratus punches with tactile cueing. He has some mild fatigue at end of session. Patient will continue to benefit from skilled therapy to maximize ROM, strength, and function.We will continue 1W6 to improve end range stiffness and strength for return to gym and basketball at some point.    OBJECTIVE IMPAIRMENTS decreased activity tolerance, decreased ROM, decreased strength, increased muscle spasms, impaired flexibility, impaired UE functional use, improper body mechanics, postural dysfunction, and pain.    ACTIVITY LIMITATIONS cleaning, community activity, driving, meal prep, and yard work.    PERSONAL FACTORS 1-2 comorbidities: asthma, bilateral shoulder pain  are also affecting patient's functional outcome.      REHAB POTENTIAL: Good   CLINICAL DECISION MAKING: Stable/uncomplicated   EVALUATION COMPLEXITY: Low     GOALS: Goals reviewed with patient? Yes   SHORT TERM GOALS: Target date: 12/12/2021   Passive flexion to 120 Baseline: Goal status: achieved   2.  Proper activation of scapular retraction Baseline:  Goal status: achieved     LONG TERM GOALS: Target date: 02/06/2022   Able to maneuver at least 5lb through full ROM  Baseline:  Goal status: 3lbs at this time    2.  GHJ strength to 75% of opp UE via tindeq/dynamometry testing Baseline:  Goal status: Achieved - 75% today achieved    3.  Pt will meet FOTO goal Baseline:  Goal status: Achieved - 1 % under goal    4.  Further goals to be set regarding return to sport at this time     PLAN: PT FREQUENCY: 1-2x/week   PT DURATION: 12 weeks   PLANNED INTERVENTIONS: Therapeutic exercises, Therapeutic activity, Neuromuscular re-education, Patient/Family education, Joint mobilization, Aquatic Therapy,  Dry Needling, Electrical stimulation, Spinal mobilization, Cryotherapy, Moist heat, Taping, Traction, and Manual therapy   PLAN FOR NEXT SESSION: Continue AROM exercise, progress shoulder strength with ER/Abd focus. Scap stability in higher planes - incorporate serratus strength. Continue ER/IR at 90abd.        PHYSICAL THERAPY DISCHARGE SUMMARY  Visits from Start of Care: 18  Current functional level related to goals / functional outcomes: Significant improvement in function   Remaining deficits: Not fully lifting    Education / Equipment: HEP   Patient agrees to discharge. Patient goals were met. Patient is being discharged due to meeting the stated rehab goals.     Lenell Antu, SPT 01/31/2022 2:32 PM   During this treatment session, the therapist was present, participating in and directing the treatment.   Burnis Medin PT DPT  01/31/22 2:32 PM

## 2022-02-07 ENCOUNTER — Ambulatory Visit (INDEPENDENT_AMBULATORY_CARE_PROVIDER_SITE_OTHER): Payer: No Typology Code available for payment source | Admitting: Orthopaedic Surgery

## 2022-02-07 DIAGNOSIS — M25311 Other instability, right shoulder: Secondary | ICD-10-CM

## 2022-02-07 NOTE — Progress Notes (Signed)
Post Operative Evaluation    Procedure/Date of Surgery: right shoulder labral repair 11/07/21  Interval History:   Presents today for 12-week follow-up following a right shoulder labral repair.  Overall he is doing very well.  He continues to make progress with physical therapy and range of motion.  He has begun strengthening at this point.  Denies any episodes of recurrence or pain  PMH/PSH/Family History/Social History/Meds/Allergies:    Past Medical History:  Diagnosis Date   Asthma    COVID-25 Aug 2019 and Jan 2022   Environmental allergies    Past Surgical History:  Procedure Laterality Date   SHOULDER ARTHROSCOPY WITH LABRAL REPAIR Right 11/07/2021   Procedure: RIGHT SHOULDER ARTHROSCOPY WITH LABRAL REPAIR;  Surgeon: [name], MD;  Location: [facility] OR;  Service: Orthopedics;  Laterality: Right;   Social History   Socioeconomic History   Marital status: Single    Spouse name: Not on file   Number of children: Not on file   Years of education: Not on file   Highest education level: Not on file  Occupational History   Not on file  Tobacco Use   Smoking status: Never   Smokeless tobacco: Never  Substance and Sexual Activity   Alcohol use: No   Drug use: No   Sexual activity: Never  Other Topics Concern   Not on file  Social History Narrative   Not on file   Social Determinants of Health   Financial Resource Strain: Not on file  Food Insecurity: Not on file  Transportation Needs: Not on file  Physical Activity: Not on file  Stress: Not on file  Social Connections: Not on file   Family History  Problem Relation Age of Onset   Hypertension Father    Hyperlipidemia Father    Hypertension Paternal Grandfather    Hyperlipidemia Paternal Grandfather    Allergies  Allergen Reactions   Cephalosporins     Unknown reaction   Crab (Diagnostic) Itching   Current Outpatient Medications  Medication Sig Dispense Refill    albuterol (VENTOLIN HFA) 108 (90 Base) MCG/ACT inhaler Inhale 2 puffs into the lungs every 6 (six) hours as needed for wheezing. 1 each 3   aspirin EC 325 MG tablet Take 1 tablet (325 mg total) by mouth daily. 30 tablet 0   oxyCODONE (OXY IR/ROXICODONE) 5 MG immediate release tablet Take 1 tablet (5 mg total) by mouth every 4 (four) hours as needed (severe pain). 20 tablet 0   No current facility-administered medications for this visit.   No results found.  Review of Systems:   A ROS was performed including pertinent positives and negatives as documented in the HPI.   Musculoskeletal Exam:    There were no vitals taken for this visit.  Right hip portals are well-healing.  No erythema or drainage.  He is able to flex and extend at the right elbow.  Active forward elevation is to 170 degrees without pain which is equal to the contralateral side.  External rotation at the side is to 60 degrees compared to 70 degrees on the contralateral side.  Internal rotation is to L1 bilaterally.  1+ anterior as well as posterior load shift 2+ radial pulse.  Imaging:    none  I personally reviewed and interpreted the radiographs.   Assessment:  12 weeks status post right shoulder labral repair.  Overall he is doing very well.  At this time he will continue to advance per protocol with his strengthening and I will plan to see him back in another 2 months.  At that time we will consider clearing him for training exercises for his specific training with [employer].  Plan :    -Return to clinic 8 weeks for reassessment      I personally saw and evaluated the patient, and participated in the management and treatment plan.  [name], MD Attending Physician, Orthopedic Surgery  This document was dictated using Dragon voice recognition software. A reasonable attempt at proof reading has been made to minimize errors.

## 2022-02-14 ENCOUNTER — Encounter: Payer: Self-pay | Admitting: Family Medicine

## 2022-02-16 ENCOUNTER — Telehealth: Payer: Self-pay | Admitting: Orthopaedic Surgery

## 2022-02-16 NOTE — Telephone Encounter (Signed)
LM holding for Dr Leanora Cover

## 2022-02-16 NOTE — Telephone Encounter (Signed)
Patient states has had his right shoulder worked on with dr Steward Drone previously and now he is having issues with his right shoulder he would like the Dr to call him to consult on what he should do, I already informed the patient that he would most likely have to come into the office and set an appt and when the next appt may be available stating the end of the month and the longer he waits the further out he may have to wait for an appt to consult on further steps concerting his shoulder pain, please advise

## 2022-02-16 NOTE — Telephone Encounter (Signed)
Ok to place new referral.  Katina Degree. [name], MD 02/16/2022 10:17 AM

## 2022-02-16 NOTE — Telephone Encounter (Signed)
Please advise 

## 2022-02-19 ENCOUNTER — Other Ambulatory Visit: Payer: Self-pay | Admitting: *Deleted

## 2022-02-19 ENCOUNTER — Encounter (HOSPITAL_BASED_OUTPATIENT_CLINIC_OR_DEPARTMENT_OTHER): Payer: Self-pay

## 2022-02-19 DIAGNOSIS — M25511 Pain in right shoulder: Secondary | ICD-10-CM

## 2022-02-19 NOTE — Telephone Encounter (Signed)
Referral placed.

## 2022-02-19 NOTE — Telephone Encounter (Signed)
Attempted to CB patient. VM was not set up. Will send MyChart message

## 2022-02-21 NOTE — Progress Notes (Signed)
   I, Christoper Fabian, LAT, ATC, am serving as scribe for Dr. Clementeen Graham.  Timothy Griffith is a 26 y.o. male who presents to [facility] at [facility] today for L shoulder pain. Pt was previously seen by Dr. Denyse Amass on 10/25/21 w/ a long hx of R shoulder instability/subluxations that Dr. Steward Drone operated on 11/07/21 (arthroscopy w/ labral repair). Today, pt c/o L shoulder pain since late March 2023 when he feels like he injured his L shoulder while playing golf on the downstroke/follow-through. Pt locates pain to his L lateral shoulder.  Radiates: into his L post upper arm L shoulder mechanical symptoms: no Aggravates: playing golf Treatments tried: nothing   Pertinent review of systems: No fevers or chills  Relevant historical information: ADHD.  Exercise asthma.  Recent right shoulder surgery labral repair.   Exam:  BP 92/62 (BP Location: Left Arm, Patient Position: Sitting, Cuff Size: Normal)   Pulse 62   Ht 6\' 1"  (1.854 m)   Wt 216 lb 9.6 oz (98.2 kg)   SpO2 96%   BMI 28.58 kg/m  General: Well Developed, well nourished, and in no acute distress.   MSK: Left shoulder: Normal-appearing.  Normal motion.  Intact strength. Minimally positive crossover arm compression test. Negative Hawkins and Neer's test.   Negative empty can test. Negative O'Brien's test. Negative Yergason's and speeds test. Pulses capillary fill and sensation are intact distally.    Lab and Radiology Results  Diagnostic Limited MSK Ultrasound of: Left shoulder Biceps tendon intact normal-appearing Subscapularis tendon normal. Supraspinatus tendon is normal-appearing with trace subacromial bursitis present. Infraspinatus tendon is normal. AC joint mild effusion. Impression: Mild subacromial bursitis      Assessment and Plan: 26 y.o. male with left shoulder pain occurring while playing golf.  Trace bursitis is present on ultrasound I think the main source of pain is probably a deltoid  strain or perhaps a rotator cuff strain or may be even a latissimus strain.  He should do well with basic home exercise program.  He has completed an extensive amount of physical therapy relatively recently following his shoulder surgery.  He should be as good at home exercises anybody could possibly be at this point.  Plan to continue home exercise program for his left shoulder now and if not improving we can add formal physical therapy or consider injection.  Check back as needed.   PDMP not reviewed this encounter. Orders Placed This Encounter  Procedures   22 LIMITED JOINT SPACE STRUCTURES UP LEFT(NO LINKED CHARGES)    Order Specific Question:   Reason for Exam (SYMPTOM  OR DIAGNOSIS REQUIRED)    Answer:   L shoulder pain    Order Specific Question:   Preferred imaging location?    Answer:   [facility]   No orders of the defined types were placed in this encounter.    Discussed warning signs or symptoms. Please see discharge instructions. Patient expresses understanding.  The above documentation has been reviewed and is accurate and complete US, M.D.

## 2022-02-22 ENCOUNTER — Ambulatory Visit: Payer: Self-pay

## 2022-02-22 ENCOUNTER — Encounter: Payer: Self-pay | Admitting: Family Medicine

## 2022-02-22 ENCOUNTER — Ambulatory Visit (INDEPENDENT_AMBULATORY_CARE_PROVIDER_SITE_OTHER): Payer: No Typology Code available for payment source | Admitting: Family Medicine

## 2022-02-22 VITALS — BP 92/62 | HR 62 | Ht 73.0 in | Wt 216.6 lb

## 2022-02-22 DIAGNOSIS — M25512 Pain in left shoulder: Secondary | ICD-10-CM

## 2022-02-22 NOTE — Patient Instructions (Addendum)
Good to see you today.  Do your HEP that you learned in PT.  Follow-up: 4-6 weeks if not improved or can refer to PT

## 2022-04-11 ENCOUNTER — Ambulatory Visit (HOSPITAL_BASED_OUTPATIENT_CLINIC_OR_DEPARTMENT_OTHER): Payer: No Typology Code available for payment source | Admitting: Orthopaedic Surgery

## 2022-04-30 ENCOUNTER — Encounter: Payer: Self-pay | Admitting: *Deleted

## 2022-07-19 ENCOUNTER — Encounter: Payer: Self-pay | Admitting: *Deleted

## 2023-03-18 NOTE — Telephone Encounter (Signed)
Patient notified, can call [facility] for appt

## 2023-03-18 NOTE — Telephone Encounter (Signed)
Ok to refer though he can probably just call and schedule since he has been there already.  Timothy Griffith. [name], MD 03/18/2023 11:58 AM

## 2023-03-18 NOTE — Telephone Encounter (Signed)
Ok to place referral.

## 2023-03-27 ENCOUNTER — Other Ambulatory Visit: Payer: Self-pay

## 2023-03-27 ENCOUNTER — Ambulatory Visit (INDEPENDENT_AMBULATORY_CARE_PROVIDER_SITE_OTHER): Payer: Self-pay | Admitting: Family Medicine

## 2023-03-27 VITALS — BP 126/78 | HR 55 | Ht 73.0 in | Wt 202.0 lb

## 2023-03-27 DIAGNOSIS — G8929 Other chronic pain: Secondary | ICD-10-CM

## 2023-03-27 DIAGNOSIS — M25512 Pain in left shoulder: Secondary | ICD-10-CM

## 2023-03-27 NOTE — Patient Instructions (Addendum)
Thank you for coming in today.   Please complete the exercises that the athletic trainer went over with you:  View at [email or web address]-exercise-[email or web address] using code: [email or web address]  Let me know if you would like a referral to Physical Therapy  Check back as needed

## 2023-03-27 NOTE — Progress Notes (Signed)
   Rubin Payor, PhD, LAT, ATC acting as a scribe for Clementeen Graham, MD.  Timothy Griffith is a 27 y.o. male who presents to [facility] at [facility] today for exacerbation of his L shoulder pain. Pt was last seen by Dr. Denyse Amass on 02/22/22 and was advised to work on HEP.   Today, pt reports he re-injured his L shoulder 2 weeks ago golfing. He was swinging his golf club when the pain started. During the swing phase.    Pertinent review of systems: No fevers or chills  Relevant historical information: History subluxation and labrum tear right shoulder ultimately requiring surgery.   Exam:  BP 126/78   Pulse (!) 55   Ht 6\' 1"  (1.854 m)   Wt 202 lb (91.6 kg)   SpO2 97%   BMI 26.65 kg/m  General: Well Developed, well nourished, and in no acute distress.   MSK: Left shoulder: Normal-appearing Normal motion. Intact strength. Shoulder stability exam testing is normal with negative O'Brien's and clunk and relocation test.      Assessment and Plan: 27 y.o. male with left shoulder pain.  Patient experienced a little bit of instability and pain not quite even a subluxation event while swinging a golf club about 10 days ago.  Has had a little bit of pain since.  This is similar to an episode that he had over a year ago July of last year where he had what sounds like a almost subluxation event that improved with home exercise program.  It has been a year since he has done physical therapy so we did some reteaching PT in clinic today and provided him a new elastic band.  If this is not sufficient he will let me know and we can add formal physical therapy.   PDMP not reviewed this encounter. No orders of the defined types were placed in this encounter.  No orders of the defined types were placed in this encounter.    Discussed warning signs or symptoms. Please see discharge instructions. Patient expresses understanding.   The above documentation has been reviewed and  is accurate and complete Clementeen Graham, M.D.

## 2023-05-03 IMAGING — MR MR SHOULDER*R* W/CM
7 series · 35 of 40 positions shown · IV contrast (agent unspecified)
Comparison: Radiographs 09/28/2021

CLINICAL DATA: Chronic right shoulder subluxations, basketball
injury in 0368. Right shoulder pain.

EXAM:
MR ARTHROGRAM OF THE right SHOULDER
TECHNIQUE: Multiplanar, multisequence MR imaging of the right shoulder was
performed following the administration of intra-articular contrast.
CONTRAST:  See Injection Documentation.

[Series 3: T1 fat-sat · axial · 4.0mm · 0.55mm/px · z∈[-74,+41]mm · 6 of 24 slices shown (1 of 3)]
[im 1/24]
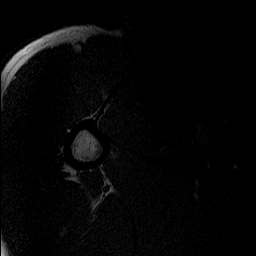
[im 5/24]
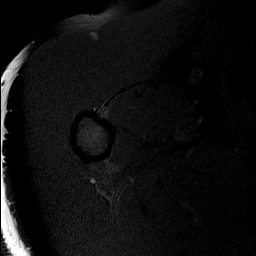
[im 10/24]
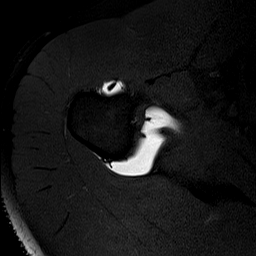
[im 14/24]
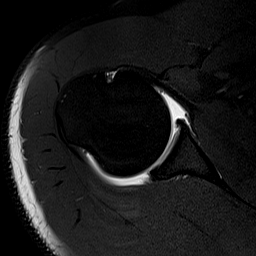
[im 19/24]
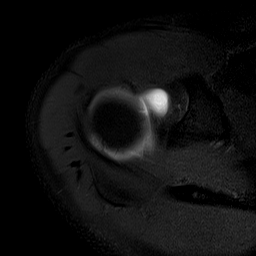
[im 24/24]
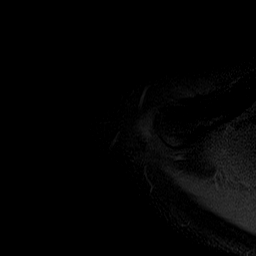

[Series 4: T1 fat-sat · oblique · 4.0mm · 0.27mm/px · 5 of 20 slices shown (2 of 3)]
[im 1/20]
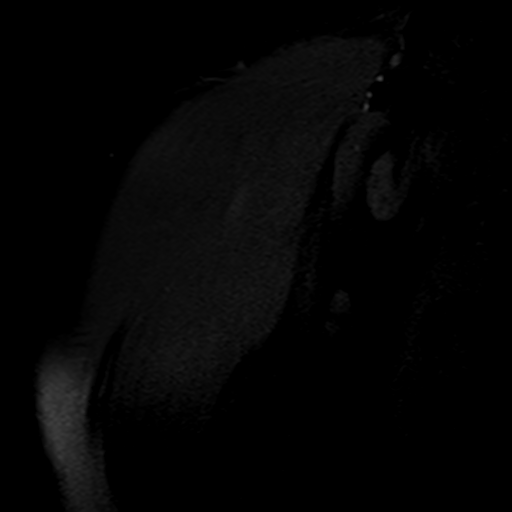
[im 5/20]
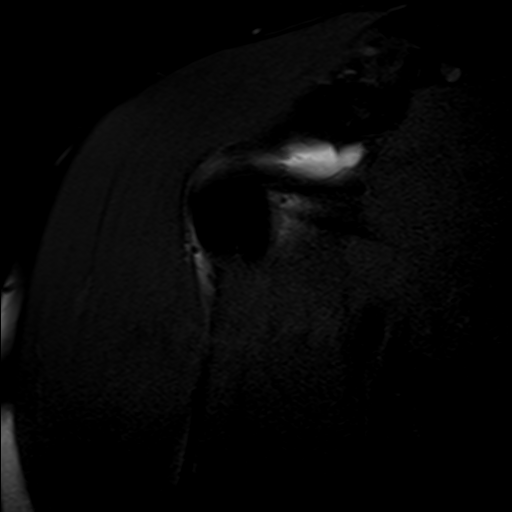
[im 10/20]
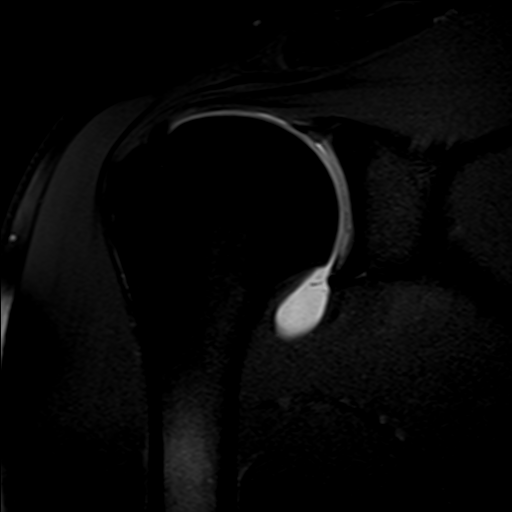
[im 15/20]
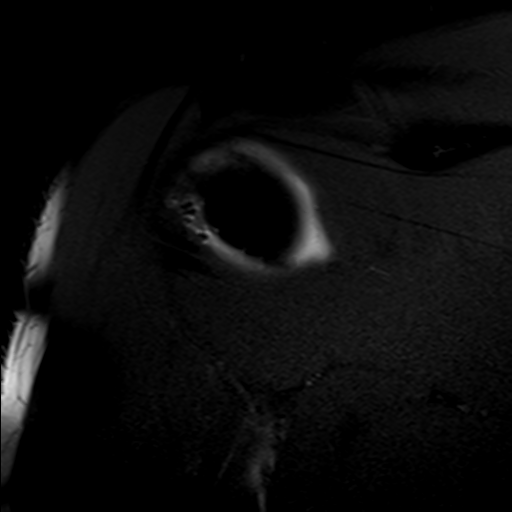
[im 20/20]
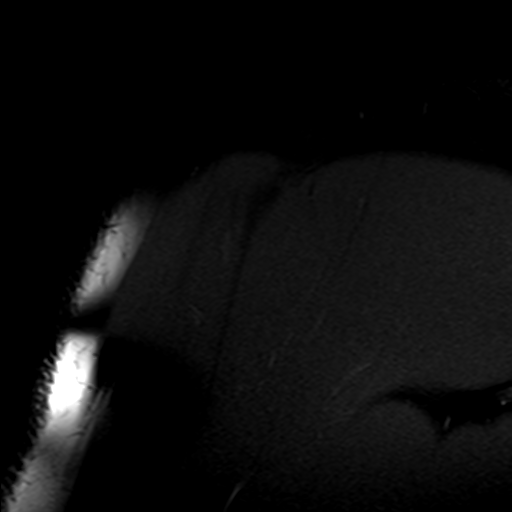

[Series 5: T2 fat-sat · oblique · 4.0mm · 0.55mm/px · 5 of 20 slices shown (1 of 3)]
[im 1/20]
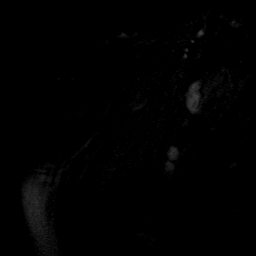
[im 5/20]
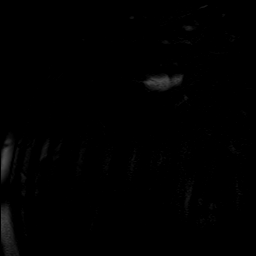
[im 10/20]
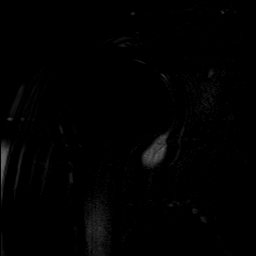
[im 15/20]
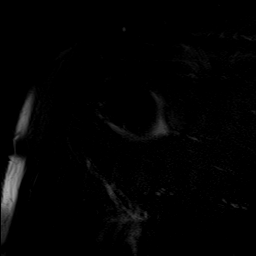
[im 20/20]
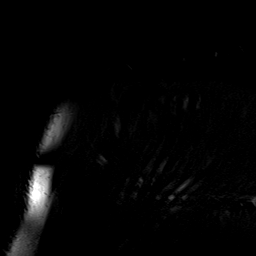

[Series 6: T1 fat-sat · oblique · non-contrast · 4.0mm · 0.55mm/px · 6 of 20 slices shown (3 of 3)]
[im 1/20]
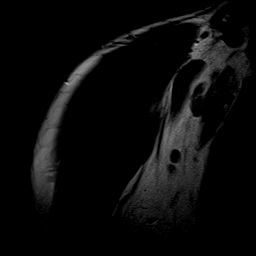
[im 4/20]
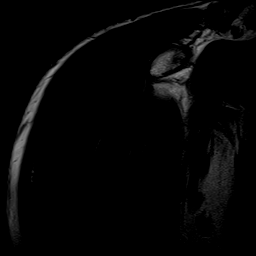
[im 8/20]
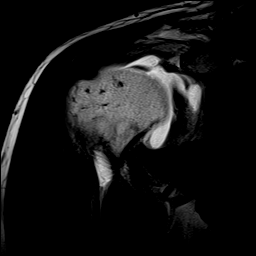
[im 12/20]
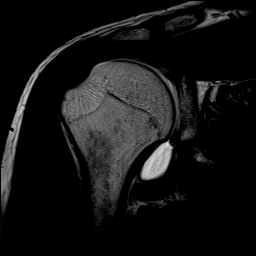
[im 16/20]
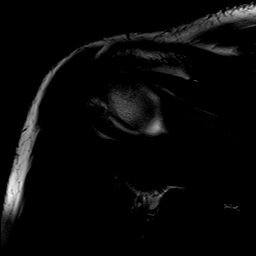
[im 20/20]
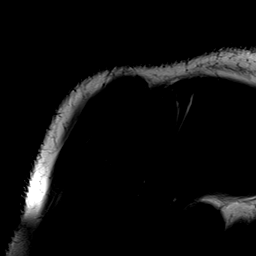

[Series 7: T2 fat-sat · oblique · 4.0mm · 0.27mm/px · 6 of 20 slices shown (2 of 3)]
[im 1/20]
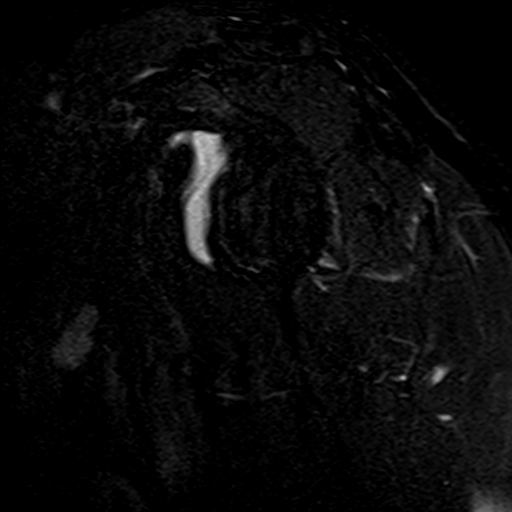
[im 4/20]
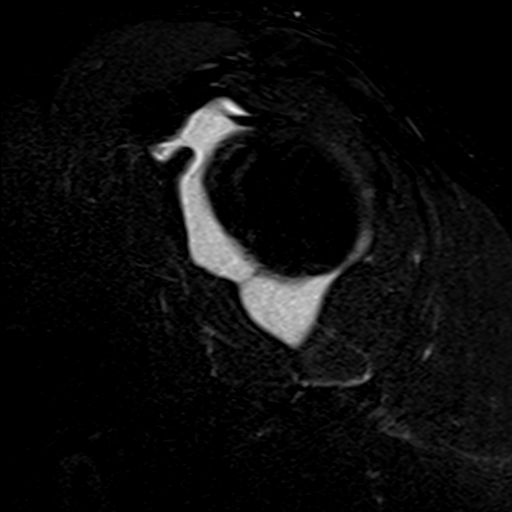
[im 8/20]
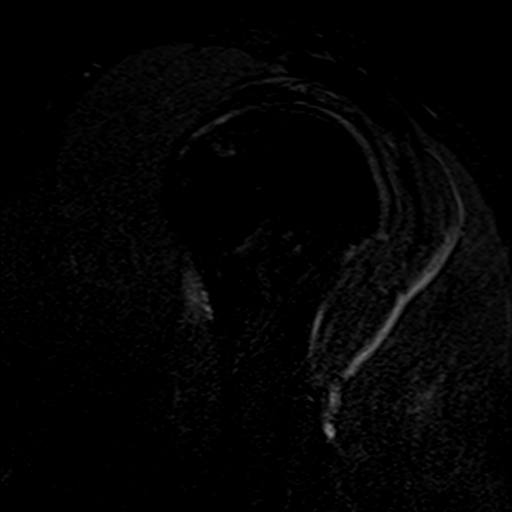
[im 12/20]
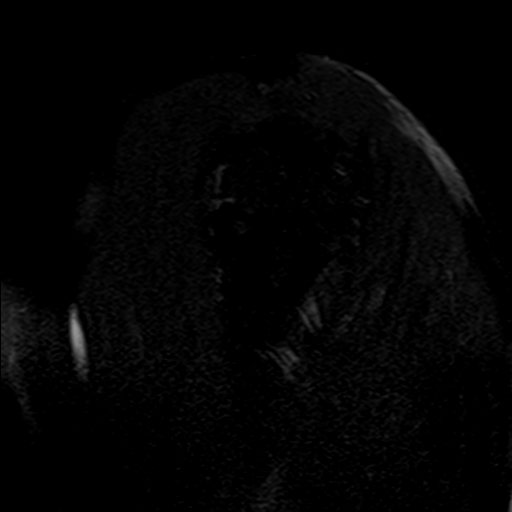
[im 16/20]
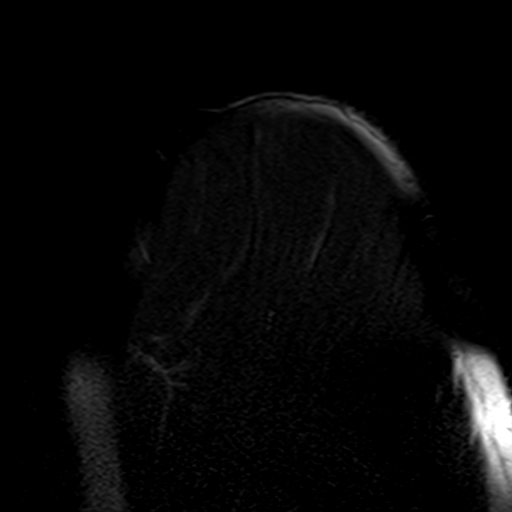
[im 20/20]
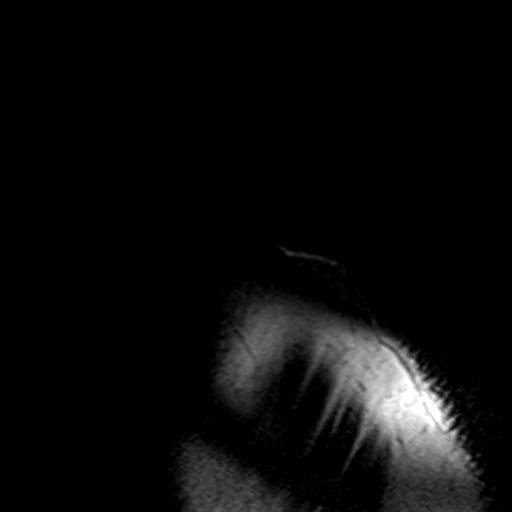

[Series 8: T2 fat-sat · oblique · 4.0mm · 0.55mm/px · 6 of 20 slices shown (3 of 3)]
[im 1/20]
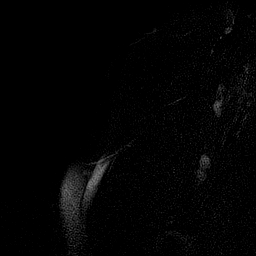
[im 4/20]
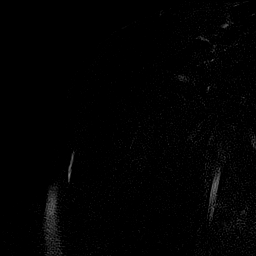
[im 8/20]
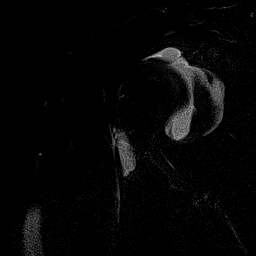
[im 12/20]
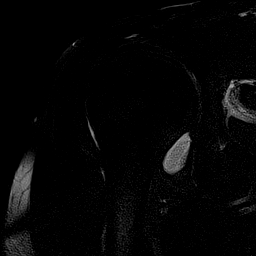
[im 16/20]
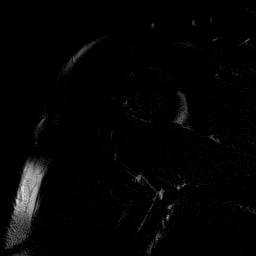
[im 20/20]
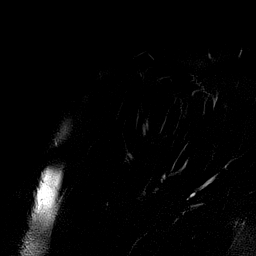

[Series 9: T1 · oblique · 4.0mm · 0.27mm/px · 1 of 20 slices shown]
[im 1/20]
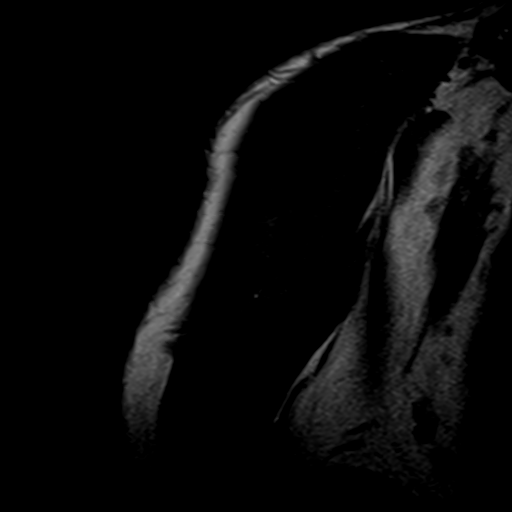

[35 of 40 positions shown; findings below may reference images not displayed]

FINDINGS: Despite efforts by the technologist and patient, motion artifact is
present on today's exam and could not be eliminated. This reduces
exam sensitivity and specificity.

Rotator cuff: Unremarkable

Muscles: Unremarkable

Biceps long head: Unremarkable

Acromioclavicular Joint: Unremarkable. Subacromial morphology is
type 2 (curved). No regional bursitis.

Glenohumeral Joint: Intact articular cartilage. Anterior capsular
attachment 1.1 cm from the labrum at the mid equatorial line on
image 12 series 3 compatible with type 3 capsular insertion which
can be associated with instability. The glenohumeral ligaments
appear intact.

Labrum: Unremarkable

Bones: No significant extra-articular osseous abnormalities
identified.
IMPRESSION: 1. Type 3 anterior capsular insertion which can be associated with
instability. Otherwise unremarkable.

## 2024-04-07 ENCOUNTER — Encounter: Payer: Self-pay | Admitting: Sports Medicine
